# Patient Record
Sex: Male | Born: 1935
Health system: Southern US, Community
[De-identification: ages and names within clinical notes are randomized; demographics above are authoritative.]

## PROBLEM LIST (undated history)

## (undated) DIAGNOSIS — E785 Hyperlipidemia, unspecified: Secondary | ICD-10-CM

## (undated) DIAGNOSIS — K589 Irritable bowel syndrome without diarrhea: Secondary | ICD-10-CM

## (undated) DIAGNOSIS — I10 Essential (primary) hypertension: Secondary | ICD-10-CM

## (undated) DIAGNOSIS — K5790 Diverticulosis of intestine, part unspecified, without perforation or abscess without bleeding: Secondary | ICD-10-CM

## (undated) DIAGNOSIS — F329 Major depressive disorder, single episode, unspecified: Secondary | ICD-10-CM

## (undated) DIAGNOSIS — I2510 Atherosclerotic heart disease of native coronary artery without angina pectoris: Secondary | ICD-10-CM

## (undated) DIAGNOSIS — F419 Anxiety disorder, unspecified: Secondary | ICD-10-CM

## (undated) DIAGNOSIS — C801 Malignant (primary) neoplasm, unspecified: Secondary | ICD-10-CM

## (undated) DIAGNOSIS — M1990 Unspecified osteoarthritis, unspecified site: Secondary | ICD-10-CM

## (undated) DIAGNOSIS — K219 Gastro-esophageal reflux disease without esophagitis: Secondary | ICD-10-CM

## (undated) DIAGNOSIS — N200 Calculus of kidney: Secondary | ICD-10-CM

## (undated) DIAGNOSIS — L570 Actinic keratosis: Secondary | ICD-10-CM

## (undated) DIAGNOSIS — F32A Depression, unspecified: Secondary | ICD-10-CM

## (undated) HISTORY — DX: Actinic keratosis: L57.0

## (undated) HISTORY — PX: APPENDECTOMY: SHX54

---

## 2005-11-11 ENCOUNTER — Ambulatory Visit: Payer: Self-pay | Admitting: Unknown Physician Specialty

## 2006-01-04 ENCOUNTER — Ambulatory Visit: Payer: Self-pay | Admitting: Unknown Physician Specialty

## 2006-12-06 ENCOUNTER — Ambulatory Visit: Payer: Self-pay | Admitting: Unknown Physician Specialty

## 2006-12-13 ENCOUNTER — Ambulatory Visit: Payer: Self-pay | Admitting: Urology

## 2007-12-25 ENCOUNTER — Ambulatory Visit: Payer: Self-pay | Admitting: Urology

## 2008-12-23 ENCOUNTER — Ambulatory Visit: Payer: Self-pay | Admitting: Urology

## 2009-04-23 ENCOUNTER — Ambulatory Visit: Payer: Self-pay | Admitting: Cardiology

## 2009-05-14 ENCOUNTER — Ambulatory Visit: Payer: Self-pay | Admitting: Internal Medicine

## 2009-09-24 ENCOUNTER — Ambulatory Visit: Payer: Self-pay | Admitting: Unknown Physician Specialty

## 2009-12-24 ENCOUNTER — Ambulatory Visit: Payer: Self-pay | Admitting: Urology

## 2010-04-08 ENCOUNTER — Ambulatory Visit: Payer: Self-pay | Admitting: Internal Medicine

## 2010-09-21 ENCOUNTER — Emergency Department: Payer: Self-pay | Admitting: Emergency Medicine

## 2010-10-02 ENCOUNTER — Ambulatory Visit: Payer: Self-pay | Admitting: Internal Medicine

## 2010-12-23 ENCOUNTER — Ambulatory Visit: Payer: Self-pay | Admitting: Urology

## 2011-12-27 ENCOUNTER — Ambulatory Visit: Payer: Self-pay | Admitting: Urology

## 2012-02-06 IMAGING — CT CT HEAD WITHOUT CONTRAST
2 series · 15 of 30 positions shown, 19 images · non-contrast
Comparison: none

REASON FOR EXAM: stroke sx
COMMENTS:   May transport without cardiac monitor

PROCEDURE:     CT  - CT HEAD WITHOUT CONTRAST  - September 21, 2010 [DATE]
RESULT:     Head CT dated 09/21/2010
TECHNIQUE: Helical noncontrasted 5 mm sections were obtained from the skull
base through the vertex.

[Series 2: without · axial · non-contrast · 0.43mm/px · z∈[+920,+1040]mm · 13 of 30 slices shown, 17 images]
[im 3/30  brain]
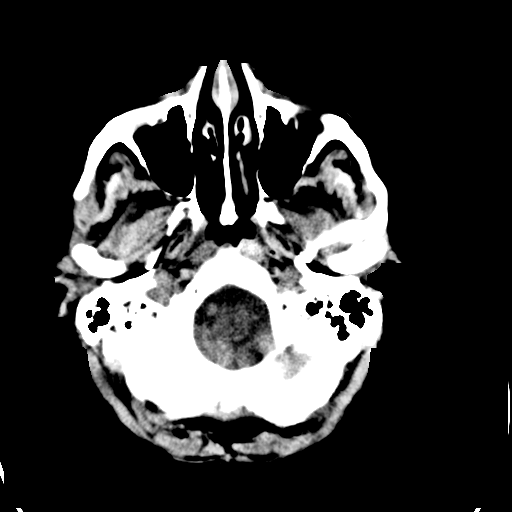
[im 3/30  bone]
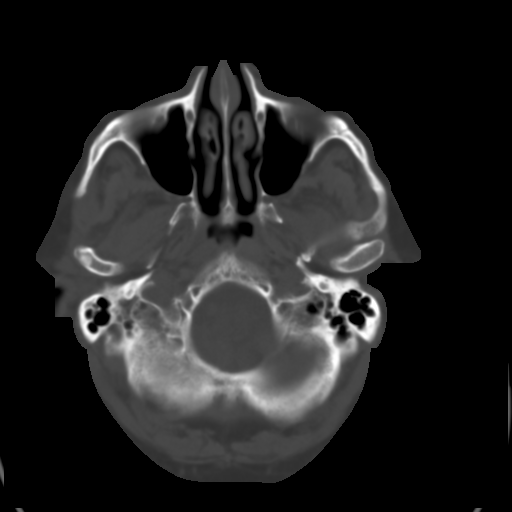
[im 5/30  brain]
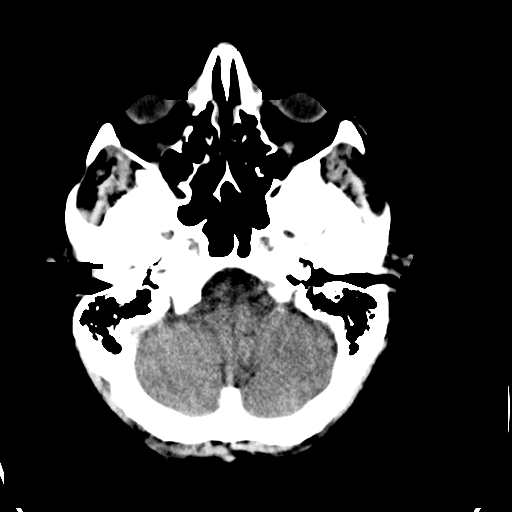
[im 7/30  brain]
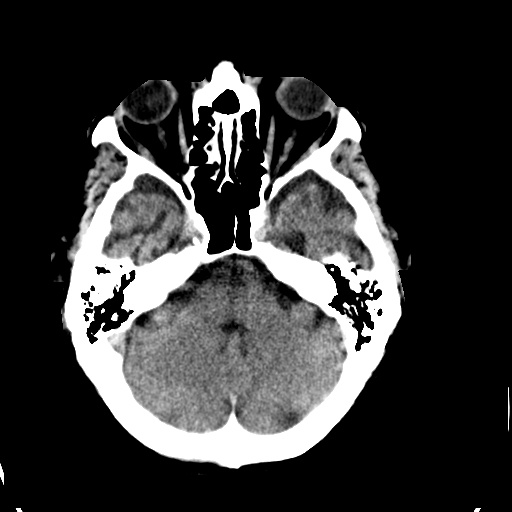
[im 9/30  brain]
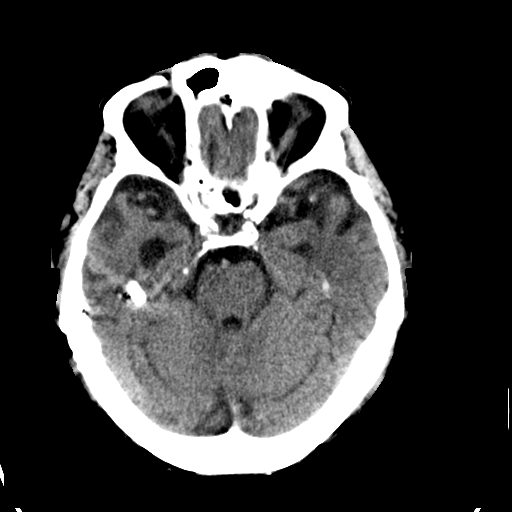
[im 11/30  brain]
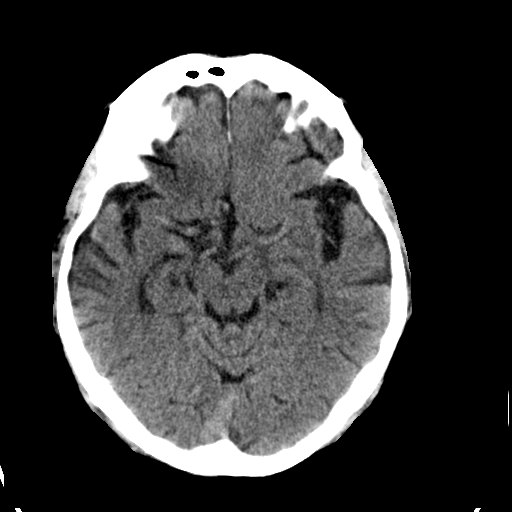
[im 11/30  bone]
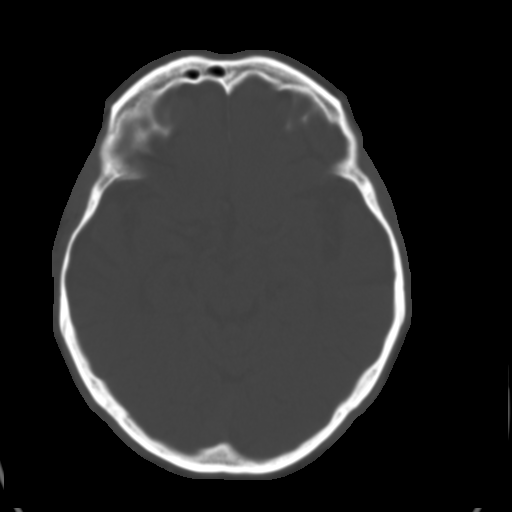
[im 13/30  brain]
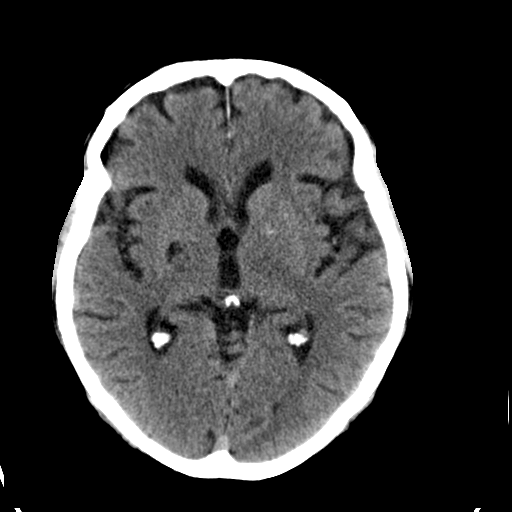
[im 15/30  brain]
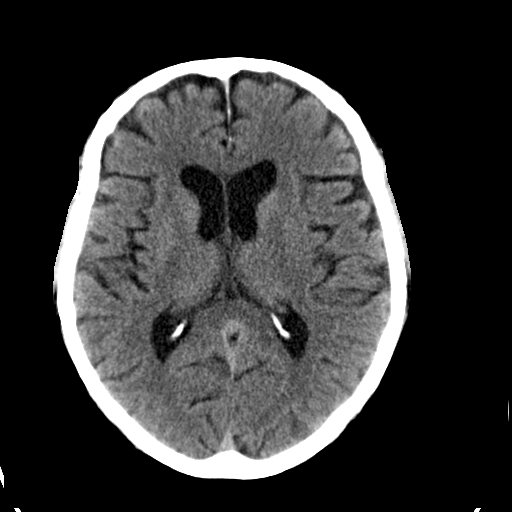
[im 17/30  brain]
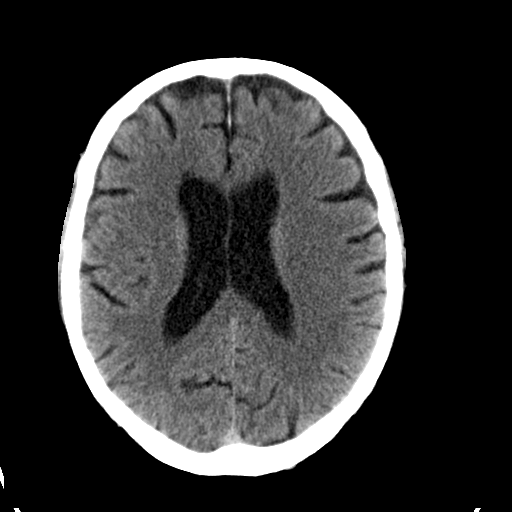
[im 19/30  brain]
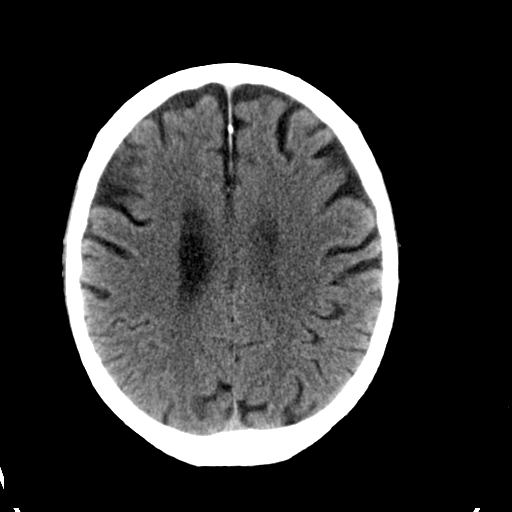
[im 19/30  bone]
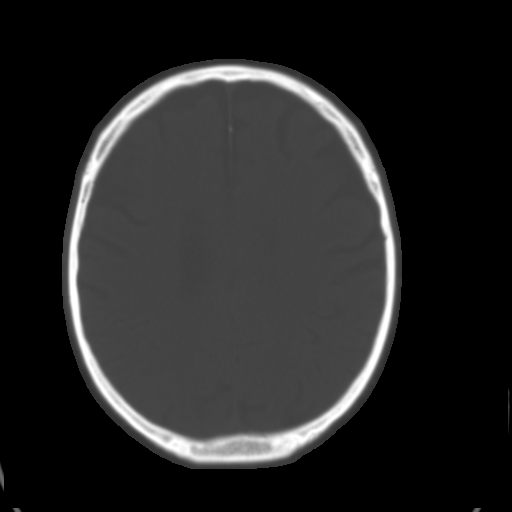
[im 21/30  brain]
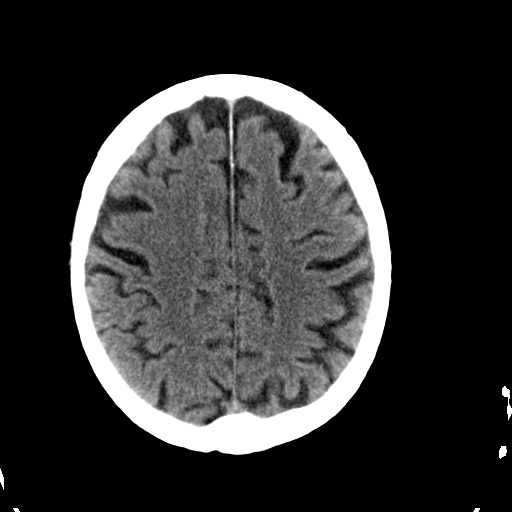
[im 23/30  brain]
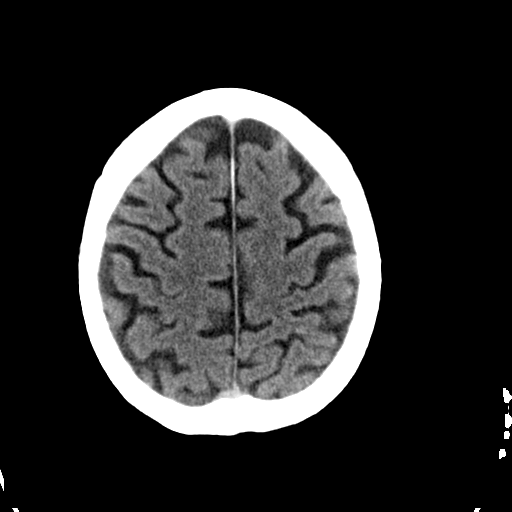
[im 25/30  brain]
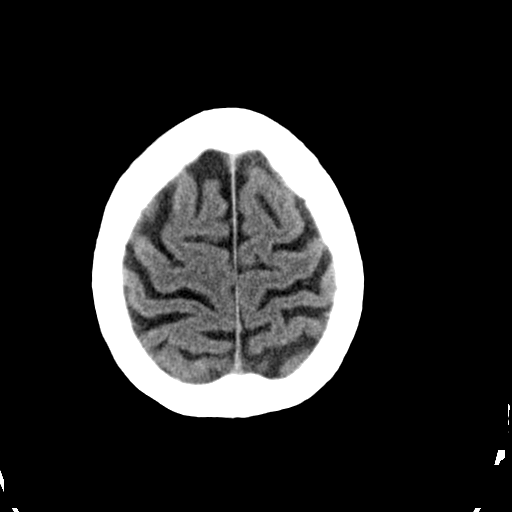
[im 27/30  brain]
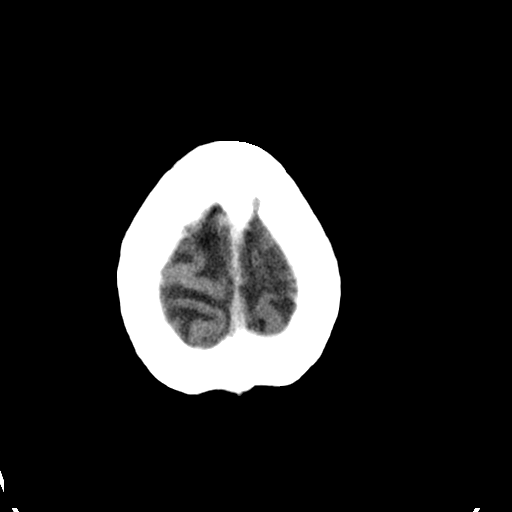
[im 27/30  bone]
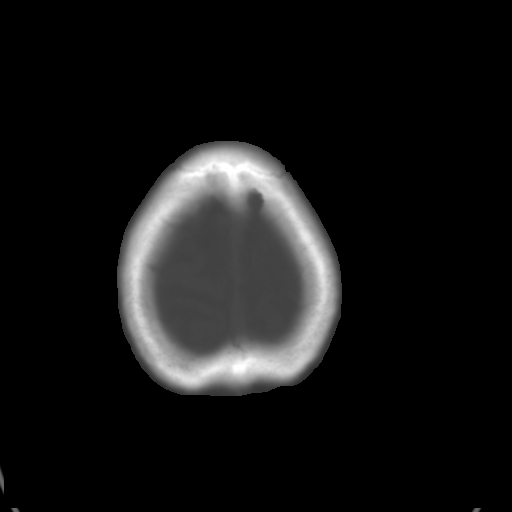

[Series 3: bone · axial · 0.42mm/px · z∈[+920,+940]mm · 2 of 30 slices shown]
[im 3/30  bone]
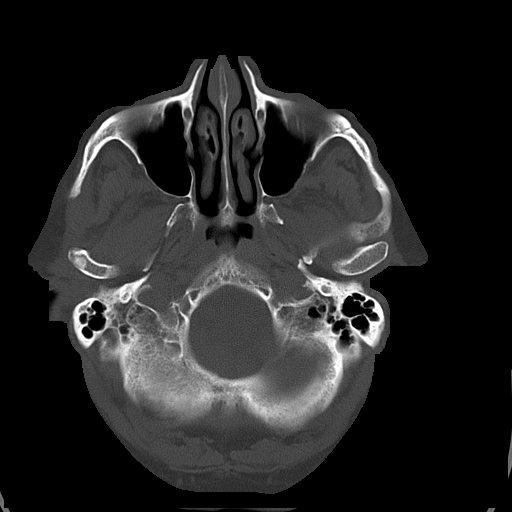
[im 7/30  bone]
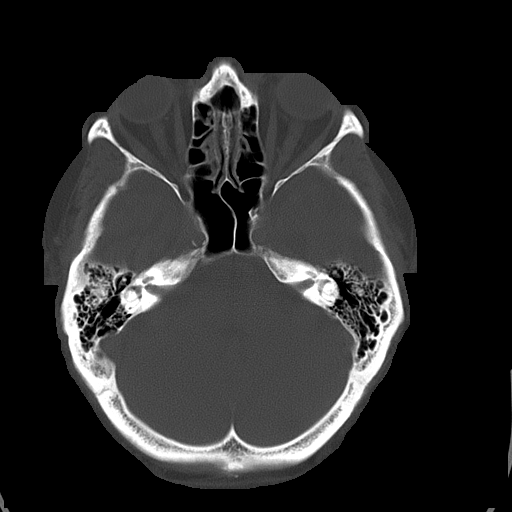

[15 of 30 positions shown; findings below may reference images not displayed]

FINDINGS: There is mild diffuse cortical atrophy. Punctate area of
low-attenuation projects along the posterior aspect of the basal ganglia
centrally border in the posterior limb of internal capsule. This is on the
right. Very mild areas of low attenuation project within the subcortical,
deep, and periventricular white matter regions. The pons and cerebellum are
grossly unremarkable. There is no evidence of a depressed skull fracture.
The ventricles and cisterns are patent. There is no evidence of intra-axial
nor extra-axial fluid collections nor evidence of acute hemorrhage common
nor mass effect. Basal ganglia calcifications are appreciated.
IMPRESSION: Chronic and mild involutional changes without evidence of
acute abnormalities.

## 2012-12-27 ENCOUNTER — Ambulatory Visit: Payer: Self-pay | Admitting: Urology

## 2013-01-15 ENCOUNTER — Ambulatory Visit: Payer: Self-pay | Admitting: Unknown Physician Specialty

## 2013-05-13 IMAGING — US US RENAL KIDNEY
1 series · 17 of 25 positions shown · non-contrast
Comparison: none

REASON FOR EXAM: multiple large cysts
COMMENTS:

[Series 1: us renal kidney · 17 of 40 slices shown]
[im 1/40]
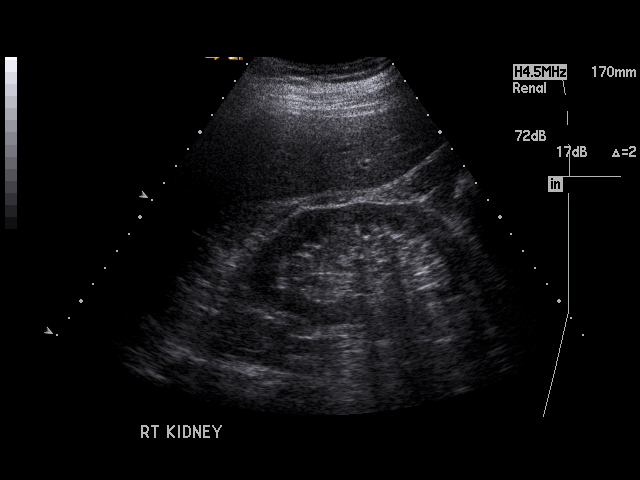
[im 4/40]
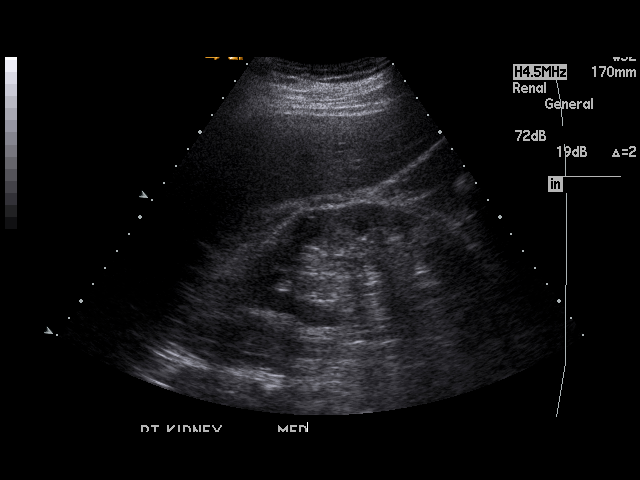
[im 5/40]
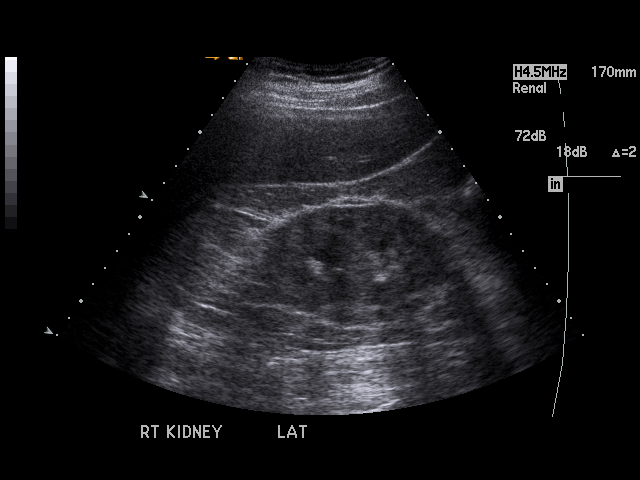
[im 9/40]
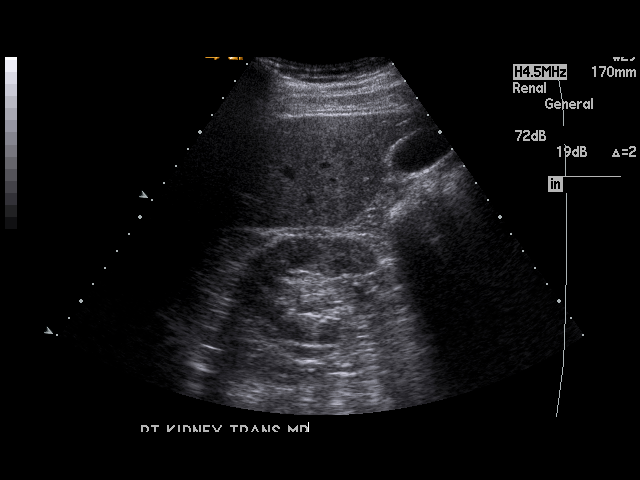
[im 10/40]
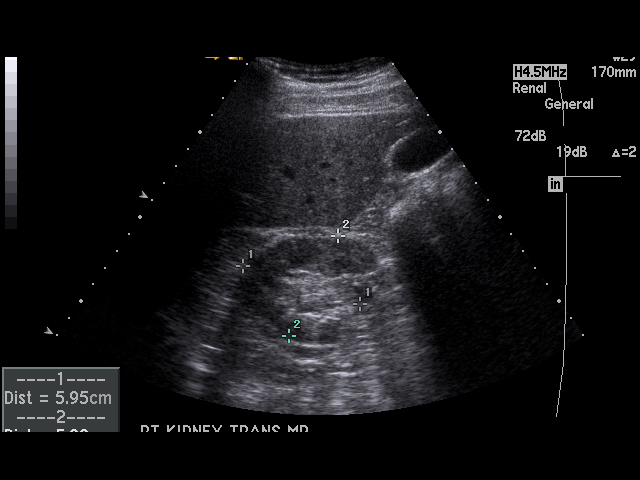
[im 14/40]
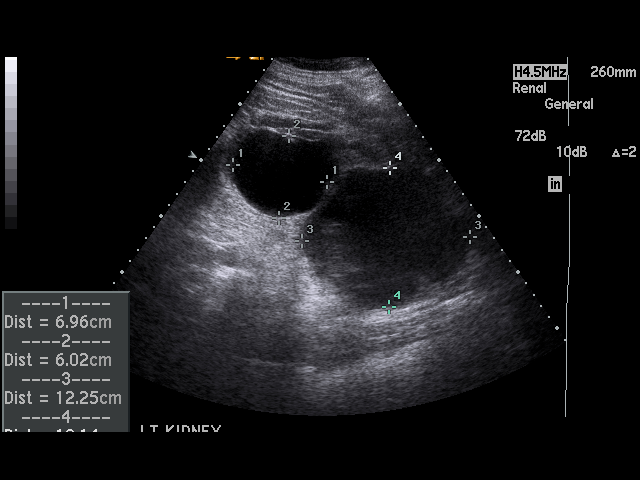
[im 15/40]
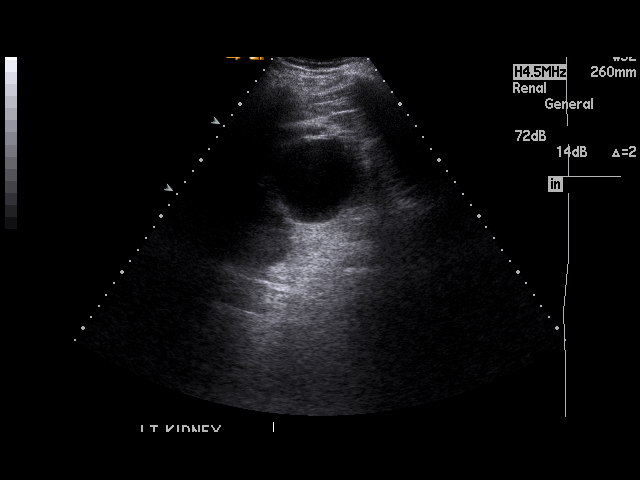
[im 18/40]
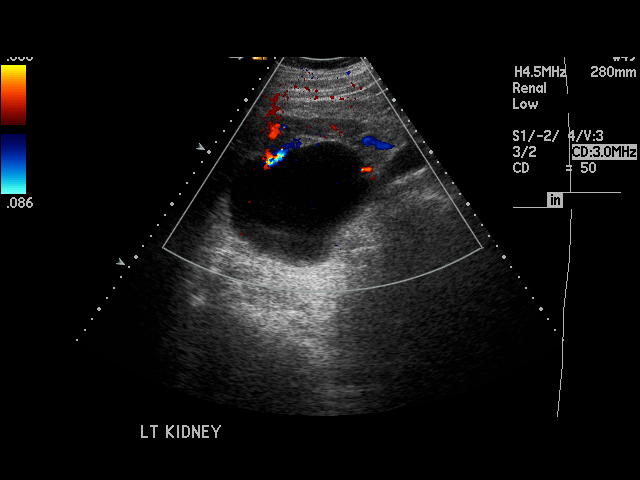
[im 20/40]
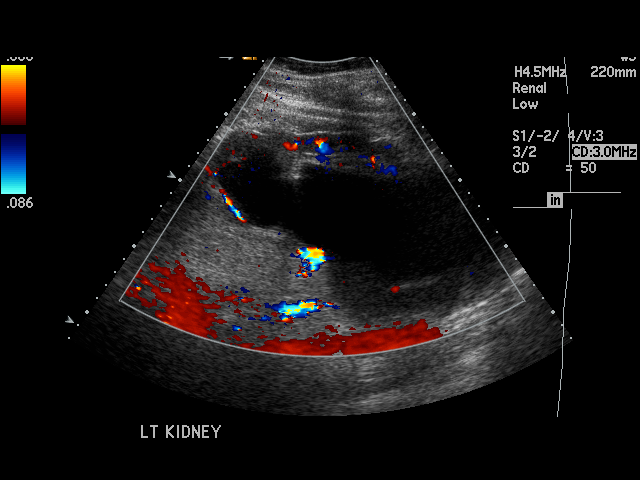
[im 22/40]
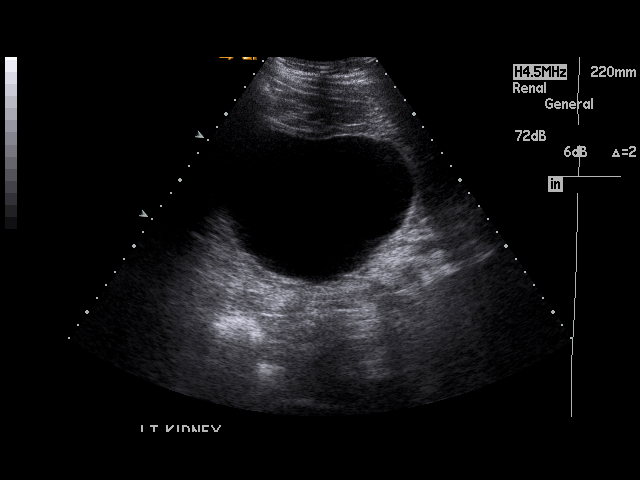
[im 25/40]
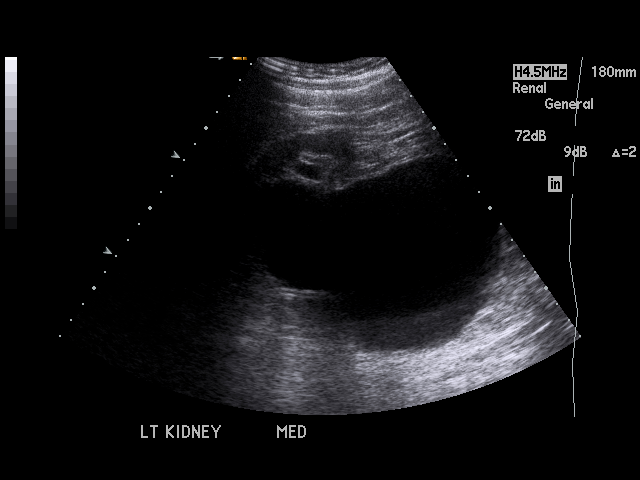
[im 27/40]
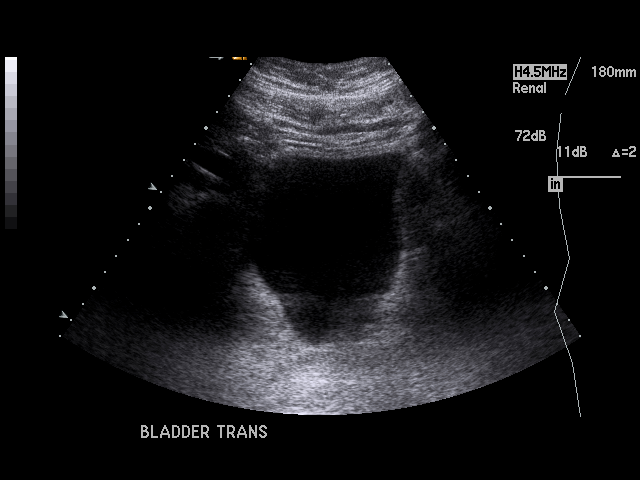
[im 30/40]
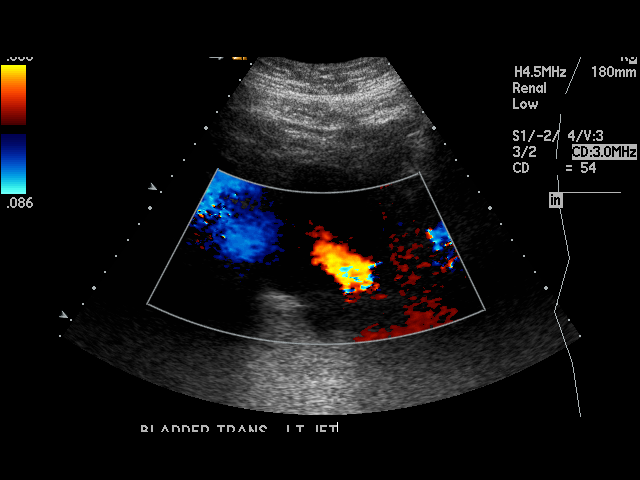
[im 31/40]
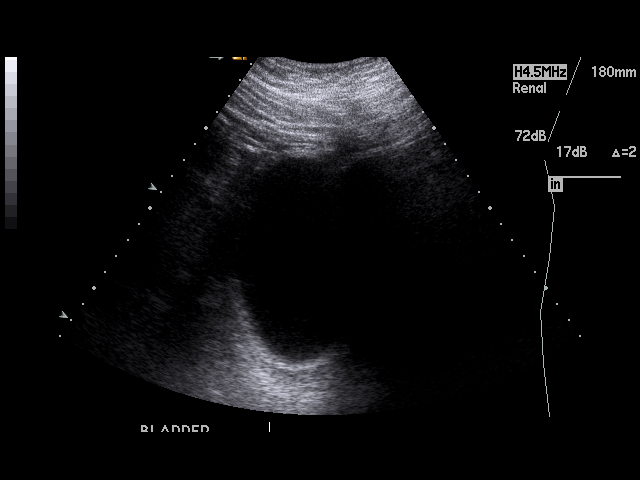
[im 35/40]
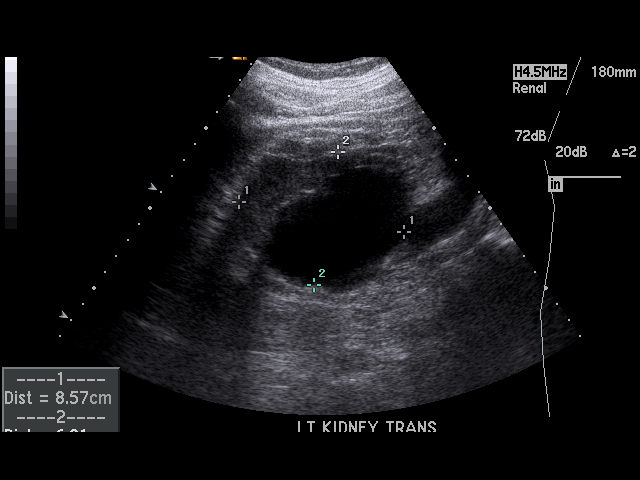
[im 36/40]
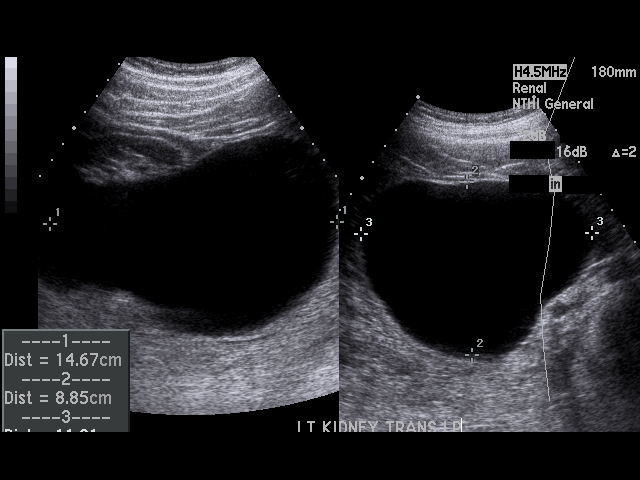
[im 40/40]
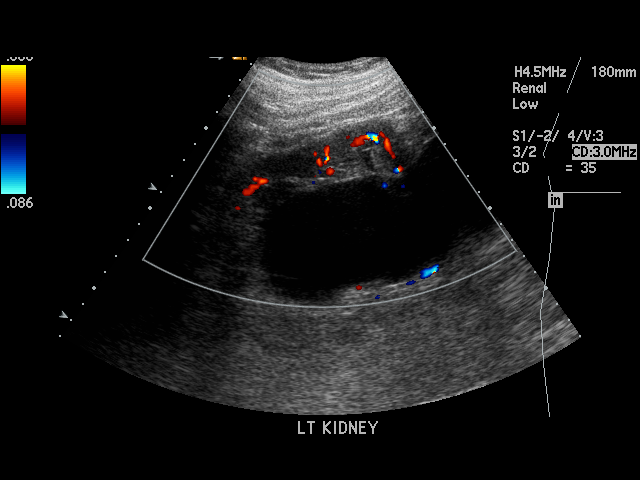

[17 of 25 positions shown; findings below may reference images not displayed]

PROCEDURE:     US  - US KIDNEY  - December 27, 2011  [DATE]

RESULT:     Renal sonogram is compared images from the previous examination
23 December, 2010. Left kidney again demonstrates large cysts in the upper
and lower pole regions with a lower pole cyst measuring up to 14.67 x 8.5 x
11.81 cm in the upper pole cyst measuring 7.57 x 5.58 x 6.75 cm. The right
kidney shows no solid or cystic mass and measures 11.19 x 5.95 x 5.39 cm.
The left kidney a shows a superior inferior dimension of 16.37 cm x 8.57 cm
x 6.91 cm.
IMPRESSION: 1. The large left renal cysts appear to be grossly stable. No definite
obstructive change, stones or solid masses evident.

## 2013-12-26 ENCOUNTER — Ambulatory Visit: Payer: Self-pay | Admitting: Urology

## 2014-01-08 ENCOUNTER — Ambulatory Visit: Payer: Self-pay

## 2014-05-23 DIAGNOSIS — C4491 Basal cell carcinoma of skin, unspecified: Secondary | ICD-10-CM

## 2014-05-23 HISTORY — DX: Basal cell carcinoma of skin, unspecified: C44.91

## 2015-01-31 DIAGNOSIS — N138 Other obstructive and reflux uropathy: Secondary | ICD-10-CM | POA: Diagnosis not present

## 2015-01-31 DIAGNOSIS — E785 Hyperlipidemia, unspecified: Secondary | ICD-10-CM | POA: Diagnosis not present

## 2015-01-31 DIAGNOSIS — N23 Unspecified renal colic: Secondary | ICD-10-CM | POA: Diagnosis not present

## 2015-01-31 DIAGNOSIS — I10 Essential (primary) hypertension: Secondary | ICD-10-CM | POA: Diagnosis not present

## 2015-01-31 DIAGNOSIS — N281 Cyst of kidney, acquired: Secondary | ICD-10-CM | POA: Diagnosis not present

## 2015-01-31 DIAGNOSIS — N401 Enlarged prostate with lower urinary tract symptoms: Secondary | ICD-10-CM | POA: Diagnosis not present

## 2015-05-06 DIAGNOSIS — E780 Pure hypercholesterolemia: Secondary | ICD-10-CM | POA: Diagnosis not present

## 2015-05-06 DIAGNOSIS — Z8673 Personal history of transient ischemic attack (TIA), and cerebral infarction without residual deficits: Secondary | ICD-10-CM | POA: Diagnosis not present

## 2015-05-06 DIAGNOSIS — I10 Essential (primary) hypertension: Secondary | ICD-10-CM | POA: Diagnosis not present

## 2015-05-13 DIAGNOSIS — K219 Gastro-esophageal reflux disease without esophagitis: Secondary | ICD-10-CM | POA: Diagnosis not present

## 2015-05-13 DIAGNOSIS — F329 Major depressive disorder, single episode, unspecified: Secondary | ICD-10-CM | POA: Diagnosis not present

## 2015-05-13 DIAGNOSIS — I10 Essential (primary) hypertension: Secondary | ICD-10-CM | POA: Diagnosis not present

## 2015-05-13 DIAGNOSIS — M1990 Unspecified osteoarthritis, unspecified site: Secondary | ICD-10-CM | POA: Diagnosis not present

## 2015-06-12 DIAGNOSIS — K4090 Unilateral inguinal hernia, without obstruction or gangrene, not specified as recurrent: Secondary | ICD-10-CM | POA: Diagnosis not present

## 2015-06-12 DIAGNOSIS — K648 Other hemorrhoids: Secondary | ICD-10-CM | POA: Diagnosis not present

## 2015-07-02 DIAGNOSIS — K648 Other hemorrhoids: Secondary | ICD-10-CM | POA: Diagnosis not present

## 2015-07-02 DIAGNOSIS — K4020 Bilateral inguinal hernia, without obstruction or gangrene, not specified as recurrent: Secondary | ICD-10-CM | POA: Diagnosis not present

## 2015-07-10 ENCOUNTER — Encounter
Admission: RE | Admit: 2015-07-10 | Discharge: 2015-07-10 | Disposition: A | Discharge status: Home / Self Care | Payer: Commercial Managed Care - HMO | Source: Ambulatory Visit | Attending: Surgery | Admitting: Surgery

## 2015-07-10 DIAGNOSIS — Z01812 Encounter for preprocedural laboratory examination: Secondary | ICD-10-CM | POA: Diagnosis not present

## 2015-07-10 DIAGNOSIS — Z01810 Encounter for preprocedural cardiovascular examination: Secondary | ICD-10-CM | POA: Diagnosis not present

## 2015-07-10 DIAGNOSIS — I10 Essential (primary) hypertension: Secondary | ICD-10-CM | POA: Diagnosis not present

## 2015-07-10 HISTORY — DX: Hyperlipidemia, unspecified: E78.5

## 2015-07-10 HISTORY — DX: Calculus of kidney: N20.0

## 2015-07-10 HISTORY — DX: Unspecified osteoarthritis, unspecified site: M19.90

## 2015-07-10 HISTORY — DX: Essential (primary) hypertension: I10

## 2015-07-10 HISTORY — DX: Irritable bowel syndrome, unspecified: K58.9

## 2015-07-10 HISTORY — DX: Atherosclerotic heart disease of native coronary artery without angina pectoris: I25.10

## 2015-07-10 HISTORY — DX: Anxiety disorder, unspecified: F41.9

## 2015-07-10 HISTORY — DX: Gastro-esophageal reflux disease without esophagitis: K21.9

## 2015-07-10 HISTORY — DX: Malignant (primary) neoplasm, unspecified: C80.1

## 2015-07-10 HISTORY — DX: Diverticulosis of intestine, part unspecified, without perforation or abscess without bleeding: K57.90

## 2015-07-10 HISTORY — DX: Depression, unspecified: F32.A

## 2015-07-10 HISTORY — DX: Major depressive disorder, single episode, unspecified: F32.9

## 2015-07-10 LAB — POTASSIUM: POTASSIUM: 3.2 mmol/L — AB (ref 3.5–5.1)

## 2015-07-10 NOTE — Pre-Procedure Instructions (Signed)
K+ results 3.2 called and faxed to  [facility] (Dr. Rochel Brome office) and instructed that patient need a potassium supplement prior to surgery

## 2015-07-10 NOTE — Patient Instructions (Addendum)
  Your procedure is scheduled on: July 18, 2015 (Friday) Report to Day Surgery. To find out your arrival time please call [phone] between 1PM - 3PM on July 17, 2015 (Thursday).  Remember: Instructions that are not followed completely may result in serious medical risk, up to and including death, or upon the discretion of your surgeon and anesthesiologist your surgery may need to be rescheduled.    __x__ 1. Do not eat food or drink liquids after midnight. No gum chewing or hard candies.     __x__ 2. No Alcohol for 24 hours before or after surgery.   ____ 3. Bring all medications with you on the day of surgery if instructed.    __x__ 4. Notify your doctor if there is any change in your medical condition     (cold, fever, infections).     Do not wear jewelry, make-up, hairpins, clips or nail polish.  Do not wear lotions, powders, or perfumes. You may wear deodorant.  Do not shave 48 hours prior to surgery. Men may shave face and neck.  Do not bring valuables to the hospital.    [facility] is not responsible for any belongings or valuables.               Contacts, dentures or bridgework may not be worn into surgery.  Leave your suitcase in the car. After surgery it may be brought to your room.  For patients admitted to the hospital, discharge time is determined by your                treatment team.   Patients discharged the day of surgery will not be allowed to drive home.   Please read over the following fact sheets that you were given:   Surgical Site Infection Prevention   _x___ Take these medicines the morning of surgery with A SIP OF WATER:    1. Verapamil  2. Prilosec (Prilosec at bedtime on July 17, 2015)  3.   4.  5.  6.  ____ Fleet Enema (as directed)   _x___ Use CHG Soap as directed  ____ Use inhalers on the day of surgery  ____ Stop metformin 2 days prior to surgery    ____ Take 1/2 of usual insulin dose the night before surgery and none on the  morning of surgery.   __x__ Stop Coumadin/Plavix/aspirin on(STOP ASPIRIN NOW--PATIENT STATED HE STOPPED ASPIRIN TODAY) ____ Stop Anti-inflammatories on    ____ Stop supplements until after surgery.    ____ Bring C-Pap to the hospital.

## 2015-07-17 DIAGNOSIS — R899 Unspecified abnormal finding in specimens from other organs, systems and tissues: Secondary | ICD-10-CM | POA: Diagnosis not present

## 2015-07-18 ENCOUNTER — Encounter: Payer: Self-pay | Admitting: *Deleted

## 2015-07-18 ENCOUNTER — Ambulatory Visit: Payer: Commercial Managed Care - HMO | Admitting: *Deleted

## 2015-07-18 ENCOUNTER — Encounter
Admission: RE | Disposition: A | Discharge status: Home / Self Care | Payer: Self-pay | Source: Ambulatory Visit | Attending: Surgery

## 2015-07-18 ENCOUNTER — Ambulatory Visit
Admission: RE | Admit: 2015-07-18 | Discharge: 2015-07-18 | Disposition: A | Discharge status: Home / Self Care | Payer: Commercial Managed Care - HMO | Source: Ambulatory Visit | Attending: Surgery | Admitting: Surgery

## 2015-07-18 DIAGNOSIS — F329 Major depressive disorder, single episode, unspecified: Secondary | ICD-10-CM | POA: Insufficient documentation

## 2015-07-18 DIAGNOSIS — I2510 Atherosclerotic heart disease of native coronary artery without angina pectoris: Secondary | ICD-10-CM | POA: Diagnosis not present

## 2015-07-18 DIAGNOSIS — E669 Obesity, unspecified: Secondary | ICD-10-CM | POA: Diagnosis not present

## 2015-07-18 DIAGNOSIS — E785 Hyperlipidemia, unspecified: Secondary | ICD-10-CM | POA: Diagnosis not present

## 2015-07-18 DIAGNOSIS — I10 Essential (primary) hypertension: Secondary | ICD-10-CM | POA: Insufficient documentation

## 2015-07-18 DIAGNOSIS — Z8673 Personal history of transient ischemic attack (TIA), and cerebral infarction without residual deficits: Secondary | ICD-10-CM | POA: Insufficient documentation

## 2015-07-18 DIAGNOSIS — Z79899 Other long term (current) drug therapy: Secondary | ICD-10-CM | POA: Insufficient documentation

## 2015-07-18 DIAGNOSIS — Z87891 Personal history of nicotine dependence: Secondary | ICD-10-CM | POA: Insufficient documentation

## 2015-07-18 DIAGNOSIS — K219 Gastro-esophageal reflux disease without esophagitis: Secondary | ICD-10-CM | POA: Insufficient documentation

## 2015-07-18 DIAGNOSIS — K4090 Unilateral inguinal hernia, without obstruction or gangrene, not specified as recurrent: Secondary | ICD-10-CM | POA: Diagnosis not present

## 2015-07-18 HISTORY — PX: INGUINAL HERNIA REPAIR: SHX194

## 2015-07-18 LAB — POCT I-STAT 3, (NA,K, HGB,HCT): Potassium, Ser: 3.6

## 2015-07-18 SURGERY — REPAIR, HERNIA, INGUINAL, ADULT
Anesthesia: General | Site: Groin | Laterality: Right | Wound class: Clean

## 2015-07-18 MED ORDER — BUPIVACAINE-EPINEPHRINE (PF) 0.5% -1:200000 IJ SOLN
INTRAMUSCULAR | Status: DC | PRN
  Administered 2015-07-18: 20 mL via PERINEURAL

## 2015-07-18 MED ORDER — EPHEDRINE SULFATE 50 MG/ML IJ SOLN
INTRAMUSCULAR | Status: DC | PRN
  Administered 2015-07-18: 5 mg via INTRAVENOUS

## 2015-07-18 MED ORDER — HYDROCODONE-ACETAMINOPHEN 5-325 MG PO TABS
1.0000 | ORAL_TABLET | ORAL | Status: DC | PRN
  Administered 2015-07-18: 1 via ORAL

## 2015-07-18 MED ORDER — LIDOCAINE HCL (CARDIAC) 20 MG/ML IV SOLN
INTRAVENOUS | Status: DC | PRN
  Administered 2015-07-18: 100 mg via INTRAVENOUS

## 2015-07-18 MED ORDER — BUPIVACAINE-EPINEPHRINE (PF) 0.5% -1:200000 IJ SOLN
INTRAMUSCULAR | Status: AC
  Filled 2015-07-18: qty 30

## 2015-07-18 MED ORDER — GLYCOPYRROLATE 0.2 MG/ML IJ SOLN
INTRAMUSCULAR | Status: DC | PRN
  Administered 2015-07-18: 0.2 mg via INTRAVENOUS

## 2015-07-18 MED ORDER — CEFAZOLIN SODIUM-DEXTROSE 2-3 GM-% IV SOLR
INTRAVENOUS | Status: AC
  Administered 2015-07-18: 2 g via INTRAVENOUS
  Filled 2015-07-18: qty 50

## 2015-07-18 MED ORDER — HYDROMORPHONE HCL 1 MG/ML IJ SOLN
0.2500 mg | INTRAMUSCULAR | Status: DC | PRN
  Administered 2015-07-18 (×2): 0.25 mg via INTRAVENOUS
  Administered 2015-07-18: 0.5 mg via INTRAVENOUS

## 2015-07-18 MED ORDER — PROPOFOL 10 MG/ML IV BOLUS
INTRAVENOUS | Status: DC | PRN
  Administered 2015-07-18: 150 mg via INTRAVENOUS
  Administered 2015-07-18: 50 mg via INTRAVENOUS

## 2015-07-18 MED ORDER — HYDROMORPHONE HCL 1 MG/ML IJ SOLN
INTRAMUSCULAR | Status: AC
  Administered 2015-07-18: 0.25 mg via INTRAVENOUS
  Filled 2015-07-18: qty 1

## 2015-07-18 MED ORDER — HYDROCODONE-ACETAMINOPHEN 5-325 MG PO TABS: 1.0000 | ORAL_TABLET | ORAL | Status: AC | PRN

## 2015-07-18 MED ORDER — ONDANSETRON HCL 4 MG/2ML IJ SOLN: 4.0000 mg | Freq: Once | INTRAMUSCULAR | Status: DC | PRN

## 2015-07-18 MED ORDER — FENTANYL CITRATE (PF) 100 MCG/2ML IJ SOLN
INTRAMUSCULAR | Status: DC | PRN
  Administered 2015-07-18 (×2): 50 ug via INTRAVENOUS

## 2015-07-18 MED ORDER — ONDANSETRON HCL 4 MG/2ML IJ SOLN
INTRAMUSCULAR | Status: DC | PRN
  Administered 2015-07-18: 4 mg via INTRAVENOUS

## 2015-07-18 MED ORDER — DEXAMETHASONE SODIUM PHOSPHATE 4 MG/ML IJ SOLN
INTRAMUSCULAR | Status: DC | PRN
  Administered 2015-07-18: 5 mg via INTRAVENOUS

## 2015-07-18 MED ORDER — CEFAZOLIN SODIUM-DEXTROSE 2-3 GM-% IV SOLR
2.0000 g | Freq: Once | INTRAVENOUS | Status: AC
  Administered 2015-07-18: 2 g via INTRAVENOUS

## 2015-07-18 MED ORDER — MIDAZOLAM HCL 2 MG/2ML IJ SOLN
INTRAMUSCULAR | Status: DC | PRN
  Administered 2015-07-18: 1 mg via INTRAVENOUS

## 2015-07-18 MED ORDER — LACTATED RINGERS IV SOLN
INTRAVENOUS | Status: DC
  Administered 2015-07-18: 13:00:00 via INTRAVENOUS

## 2015-07-18 SURGICAL SUPPLY — 24 items
BLADE SURG 15 STRL LF DISP TIS (BLADE) ×1 IMPLANT
BLADE SURG 15 STRL SS (BLADE) ×2
CANISTER SUCT 1200ML W/VALVE (MISCELLANEOUS) ×3 IMPLANT
CHLORAPREP W/TINT 26ML (MISCELLANEOUS) ×6 IMPLANT
DRAIN PENROSE 5/8X18 LTX STRL (WOUND CARE) ×3 IMPLANT
DRAPE PED LAPAROTOMY (DRAPES) ×3 IMPLANT
GLOVE BIO SURGEON STRL SZ7.5 (GLOVE) ×9 IMPLANT
GOWN STRL REUS W/ TWL LRG LVL3 (GOWN DISPOSABLE) ×2 IMPLANT
GOWN STRL REUS W/TWL LRG LVL3 (GOWN DISPOSABLE) ×4
KIT RM TURNOVER STRD PROC AR (KITS) ×3 IMPLANT
LABEL OR SOLS (LABEL) ×3 IMPLANT
LIQUID BAND (GAUZE/BANDAGES/DRESSINGS) ×3 IMPLANT
MESH SYNTHETIC 4X6 SOFT BARD (Mesh General) ×1 IMPLANT
MESH SYNTHETIC SOFT BARD 4X6 (Mesh General) ×2 IMPLANT
NDL SAFETY 25GX1.5 (NEEDLE) ×3 IMPLANT
NS IRRIG 500ML POUR BTL (IV SOLUTION) ×3 IMPLANT
PACK BASIN MINOR ARMC (MISCELLANEOUS) ×3 IMPLANT
PAD GROUND ADULT SPLIT (MISCELLANEOUS) ×3 IMPLANT
SUT CHROMIC 4 0 RB 1X27 (SUTURE) ×3 IMPLANT
SUT MNCRL AB 4-0 PS2 18 (SUTURE) ×3 IMPLANT
SUT SURGILON 0 30 BLK (SUTURE) ×9 IMPLANT
SUT VIC AB 4-0 SH 27 (SUTURE) ×2
SUT VIC AB 4-0 SH 27XANBCTRL (SUTURE) ×1 IMPLANT
SYRINGE 10CC LL (SYRINGE) ×3 IMPLANT

## 2015-07-18 NOTE — Discharge Instructions (Signed)
Take Tylenol or Norco if needed for pain.  Resume aspirin on Sunday.  Avoid straining and heavy lifting.  May shower.   AMBULATORY SURGERY  DISCHARGE INSTRUCTIONS   1) The drugs that you were given will stay in your system until tomorrow so for the next 24 hours you should not:  A) Drive an automobile B) Make any legal decisions C) Drink any alcoholic beverage   2) You may resume regular meals tomorrow.  Today it is better to start with liquids and gradually work up to solid foods.  You may eat anything you prefer, but it is better to start with liquids, then soup and crackers, and gradually work up to solid foods.   3) Please notify your doctor immediately if you have any unusual bleeding, trouble breathing, redness and pain at the surgery site, drainage, fever, or pain not relieved by medication.    4) Additional Instructions:        Please contact your physician with any problems or Same Day Surgery at [phone], Monday through Friday 6 am to 4 pm, or [place] at [facility] number at [phone].

## 2015-07-18 NOTE — Op Note (Signed)
OPERATIVE REPORT  PREOPERATIVE DIAGNOSIS: right inguinal hernia  POSTOPERATIVE DIAGNOSIS:right  inguinal hernia  PROCEDURE:  right inguinal hernia repair  ANESTHESIA:  General  SURGEON:  Rochel Brome M.D.  INDICATIONS: He reports recent bulging and moderate discomfort in the right groin. A right inguinal hernia was demonstrated on physical exam and repair was recommended for definitive treatment.  With the patient on the operating table in the supine position the right lower quadrant was prepared with clippers and with ChloraPrep and draped in a sterile manner. A transversely oriented suprapubic incision was made and carried down through subcutaneous tissues. Electrocautery was used for hemostasis. Two traversing veins were divided between 4-0 chromic suture ligatures. 2 The Scarpa's fascia was incised. The external oblique aponeurosis was incised along the course of its fibers to open the external ring and expose the inguinal cord structures. The cord structures were mobilized. A Penrose drain was passed around the cord structures for traction. There was a large direct inguinal hernia identified. Cremaster fibers were spread and demonstrated no indirect hernia. The direct hernia sac was dissected free from surrounding structures and was approximately 8 cm in length the attenuated transversalis fascia was incised circumferentially and removed. The sac was inverted. The floor of the inguinal canal was repaired with a row of 0 Surgilon sutures suturing the conjoined tendon to the shelving edge of the inguinal ligament incorporating transversalis fascia into the repair. The last stitch led to satisfactory narrowing of the internal ring. Bard soft mesh was cut to create an oval shape and was placed over the repair. This was sutured to the repair with interrupted 0 Surgilon sutures and also sutured medially to the deep fascia and on both sides of the internal ring. Next after seeing hemostasis was intact  the cord structures were replaced along the floor of the inguinal canal. The cut edges of the external oblique aponeurosis were closed with a running 4-0 Vicryl suture to re-create the external ring. The deep fascia superior and lateral to the repair site was infiltrated with half percent Sensorcaine with epinephrine. Subcutaneous tissues were also infiltrated. The Scarpa's fascia was closed with interrupted 4-0 Vicryl sutures. The skin was closed with running 4-0 Monocryl subcuticular suture and LiquiBand. The testicle remained in the scrotum  The patient appeared to be in satisfactory condition and was prepared for transfer to the recovery room.  Rochel Brome M.D.

## 2015-07-18 NOTE — Transfer of Care (Signed)
Immediate Anesthesia Transfer of Care Note  Patient: Edward Winters  Procedure(s) Performed: Procedure(s): HERNIA REPAIR INGUINAL ADULT (Right)  Patient Location: PACU  Anesthesia Type:General  Level of Consciousness: awake, alert , oriented and patient cooperative  Airway & Oxygen Therapy: Patient Spontanous Breathing and Patient connected to face mask oxygen  Post-op Assessment: Report given to RN, Post -op Vital signs reviewed and stable and Patient moving all extremities X 4  Post vital signs: Reviewed and stable  Last Vitals:  Filed Vitals:   07/18/15 1528  BP: 153/79  Pulse: 71  Temp: 36.3 C  Resp: 14    Complications: No apparent anesthesia complications

## 2015-07-18 NOTE — H&P (Signed)
  No change in condition since office visit.  Supplementing k  Marked right side, discussed plan for surgery.

## 2015-07-18 NOTE — Anesthesia Preprocedure Evaluation (Addendum)
Anesthesia Evaluation  Patient identified by MRN, date of birth, ID band Patient confused    Reviewed: Allergy & Precautions, NPO status , Patient's Chart, lab work & pertinent test results  Airway Mallampati: III  TM Distance: >3 FB Neck ROM: Limited    Dental  (+) Caps, Dental Advisory Given   Pulmonary former smoker,    Pulmonary exam normal       Cardiovascular Exercise Tolerance: Poor hypertension, Pt. on medications + CAD Rhythm:Regular Rate:Normal     Neuro/Psych Depression Left leg weakness. CVA, Residual Symptoms    GI/Hepatic GERD-  Medicated and Controlled,  Endo/Other    Renal/GU      Musculoskeletal   Abdominal (+) + obese,   Peds  Hematology   Anesthesia Other Findings   Reproductive/Obstetrics                            Anesthesia Physical Anesthesia Plan  ASA: III  Anesthesia Plan: General   Post-op Pain Management:    Induction: Intravenous  Airway Management Planned: LMA  Additional Equipment:   Intra-op Plan:   Post-operative Plan: Extubation in OR  Informed Consent: I have reviewed the patients History and Physical, chart, labs and discussed the procedure including the risks, benefits and alternatives for the proposed anesthesia with the patient or authorized representative who has indicated his/her understanding and acceptance.     Plan Discussed with: CRNA  Anesthesia Plan Comments:         Anesthesia Quick Evaluation

## 2015-07-18 NOTE — Anesthesia Postprocedure Evaluation (Signed)
  Anesthesia Post-op Note  Patient: Edward Winters  Procedure(s) Performed: Procedure(s): HERNIA REPAIR INGUINAL ADULT (Right)  Anesthesia type:General  Patient location: PACU  Post pain: Pain level controlled  Post assessment: Post-op Vital signs reviewed, Patient's Cardiovascular Status Stable, Respiratory Function Stable, Patent Airway and No signs of Nausea or vomiting  Post vital signs: Reviewed and stable  Last Vitals:  Filed Vitals:   07/18/15 1302  BP: 143/69  Pulse: 64  Temp: 36.7 C  Resp: 16    Level of consciousness: awake, alert  and patient cooperative  Complications: No apparent anesthesia complications

## 2015-07-18 NOTE — Anesthesia Procedure Notes (Signed)
Procedure Name: LMA Insertion Date/Time: 07/18/2015 1:55 PM Performed by: Silvana Newness Pre-anesthesia Checklist: Patient identified, Emergency Drugs available, Suction available, Patient being monitored and Timeout performed Patient Re-evaluated:Patient Re-evaluated prior to inductionOxygen Delivery Method: Circle system utilized Preoxygenation: Pre-oxygenation with 100% oxygen Intubation Type: IV induction Ventilation: Mask ventilation without difficulty and Oral airway inserted - appropriate to patient size LMA: LMA inserted LMA Size: 4.5 Number of attempts: 1 Placement Confirmation: positive ETCO2 and breath sounds checked- equal and bilateral Tube secured with: Tape Dental Injury: Teeth and Oropharynx as per pre-operative assessment

## 2015-07-21 ENCOUNTER — Encounter: Payer: Self-pay | Admitting: Surgery

## 2015-09-08 DIAGNOSIS — L570 Actinic keratosis: Secondary | ICD-10-CM | POA: Diagnosis not present

## 2015-09-08 DIAGNOSIS — L720 Epidermal cyst: Secondary | ICD-10-CM | POA: Diagnosis not present

## 2015-09-08 DIAGNOSIS — Z1283 Encounter for screening for malignant neoplasm of skin: Secondary | ICD-10-CM | POA: Diagnosis not present

## 2015-09-08 DIAGNOSIS — D229 Melanocytic nevi, unspecified: Secondary | ICD-10-CM | POA: Diagnosis not present

## 2015-09-08 DIAGNOSIS — L578 Other skin changes due to chronic exposure to nonionizing radiation: Secondary | ICD-10-CM | POA: Diagnosis not present

## 2015-09-08 DIAGNOSIS — Z85828 Personal history of other malignant neoplasm of skin: Secondary | ICD-10-CM | POA: Diagnosis not present

## 2015-09-08 DIAGNOSIS — D1800 Hemangioma unspecified site: Secondary | ICD-10-CM | POA: Diagnosis not present

## 2015-09-08 DIAGNOSIS — L821 Other seborrheic keratosis: Secondary | ICD-10-CM | POA: Diagnosis not present

## 2015-12-02 DIAGNOSIS — I10 Essential (primary) hypertension: Secondary | ICD-10-CM | POA: Diagnosis not present

## 2015-12-02 DIAGNOSIS — K219 Gastro-esophageal reflux disease without esophagitis: Secondary | ICD-10-CM | POA: Diagnosis not present

## 2015-12-02 DIAGNOSIS — M1990 Unspecified osteoarthritis, unspecified site: Secondary | ICD-10-CM | POA: Diagnosis not present

## 2015-12-10 DIAGNOSIS — N183 Chronic kidney disease, stage 3 (moderate): Secondary | ICD-10-CM | POA: Diagnosis not present

## 2015-12-10 DIAGNOSIS — Z8673 Personal history of transient ischemic attack (TIA), and cerebral infarction without residual deficits: Secondary | ICD-10-CM | POA: Diagnosis not present

## 2015-12-10 DIAGNOSIS — M1990 Unspecified osteoarthritis, unspecified site: Secondary | ICD-10-CM | POA: Diagnosis not present

## 2015-12-10 DIAGNOSIS — K219 Gastro-esophageal reflux disease without esophagitis: Secondary | ICD-10-CM | POA: Diagnosis not present

## 2015-12-10 DIAGNOSIS — N401 Enlarged prostate with lower urinary tract symptoms: Secondary | ICD-10-CM | POA: Diagnosis not present

## 2015-12-10 DIAGNOSIS — I10 Essential (primary) hypertension: Secondary | ICD-10-CM | POA: Diagnosis not present

## 2015-12-10 DIAGNOSIS — R229 Localized swelling, mass and lump, unspecified: Secondary | ICD-10-CM | POA: Diagnosis not present

## 2015-12-10 DIAGNOSIS — F325 Major depressive disorder, single episode, in full remission: Secondary | ICD-10-CM | POA: Diagnosis not present

## 2015-12-10 DIAGNOSIS — E7800 Pure hypercholesterolemia, unspecified: Secondary | ICD-10-CM | POA: Diagnosis not present

## 2016-01-05 DIAGNOSIS — L821 Other seborrheic keratosis: Secondary | ICD-10-CM | POA: Diagnosis not present

## 2016-01-05 DIAGNOSIS — L820 Inflamed seborrheic keratosis: Secondary | ICD-10-CM | POA: Diagnosis not present

## 2016-01-05 DIAGNOSIS — Z85828 Personal history of other malignant neoplasm of skin: Secondary | ICD-10-CM | POA: Diagnosis not present

## 2016-01-05 DIAGNOSIS — L720 Epidermal cyst: Secondary | ICD-10-CM | POA: Diagnosis not present

## 2016-01-05 DIAGNOSIS — D1800 Hemangioma unspecified site: Secondary | ICD-10-CM | POA: Diagnosis not present

## 2016-02-03 DIAGNOSIS — L720 Epidermal cyst: Secondary | ICD-10-CM | POA: Diagnosis not present

## 2016-02-03 DIAGNOSIS — D229 Melanocytic nevi, unspecified: Secondary | ICD-10-CM | POA: Diagnosis not present

## 2016-02-03 DIAGNOSIS — D485 Neoplasm of uncertain behavior of skin: Secondary | ICD-10-CM | POA: Diagnosis not present

## 2016-03-03 DIAGNOSIS — N401 Enlarged prostate with lower urinary tract symptoms: Secondary | ICD-10-CM | POA: Diagnosis not present

## 2016-03-03 DIAGNOSIS — Z7982 Long term (current) use of aspirin: Secondary | ICD-10-CM | POA: Diagnosis not present

## 2016-03-03 DIAGNOSIS — I679 Cerebrovascular disease, unspecified: Secondary | ICD-10-CM | POA: Diagnosis not present

## 2016-03-03 DIAGNOSIS — N281 Cyst of kidney, acquired: Secondary | ICD-10-CM | POA: Diagnosis not present

## 2016-03-03 DIAGNOSIS — E785 Hyperlipidemia, unspecified: Secondary | ICD-10-CM | POA: Diagnosis not present

## 2016-03-03 DIAGNOSIS — I10 Essential (primary) hypertension: Secondary | ICD-10-CM | POA: Diagnosis not present

## 2016-03-03 DIAGNOSIS — N138 Other obstructive and reflux uropathy: Secondary | ICD-10-CM | POA: Diagnosis not present

## 2016-03-03 DIAGNOSIS — M21372 Foot drop, left foot: Secondary | ICD-10-CM | POA: Diagnosis not present

## 2016-03-03 DIAGNOSIS — Z6832 Body mass index (BMI) 32.0-32.9, adult: Secondary | ICD-10-CM | POA: Diagnosis not present

## 2016-03-03 DIAGNOSIS — Z79899 Other long term (current) drug therapy: Secondary | ICD-10-CM | POA: Diagnosis not present

## 2016-03-03 DIAGNOSIS — N23 Unspecified renal colic: Secondary | ICD-10-CM | POA: Diagnosis not present

## 2016-03-03 DIAGNOSIS — Z87891 Personal history of nicotine dependence: Secondary | ICD-10-CM | POA: Diagnosis not present

## 2016-06-02 DIAGNOSIS — N183 Chronic kidney disease, stage 3 (moderate): Secondary | ICD-10-CM | POA: Diagnosis not present

## 2016-06-02 DIAGNOSIS — I10 Essential (primary) hypertension: Secondary | ICD-10-CM | POA: Diagnosis not present

## 2016-06-02 DIAGNOSIS — Z8673 Personal history of transient ischemic attack (TIA), and cerebral infarction without residual deficits: Secondary | ICD-10-CM | POA: Diagnosis not present

## 2016-06-02 DIAGNOSIS — M1990 Unspecified osteoarthritis, unspecified site: Secondary | ICD-10-CM | POA: Diagnosis not present

## 2016-06-10 DIAGNOSIS — N183 Chronic kidney disease, stage 3 (moderate): Secondary | ICD-10-CM | POA: Diagnosis not present

## 2016-06-10 DIAGNOSIS — I10 Essential (primary) hypertension: Secondary | ICD-10-CM | POA: Diagnosis not present

## 2016-06-10 DIAGNOSIS — F325 Major depressive disorder, single episode, in full remission: Secondary | ICD-10-CM | POA: Diagnosis not present

## 2016-06-10 DIAGNOSIS — E7800 Pure hypercholesterolemia, unspecified: Secondary | ICD-10-CM | POA: Diagnosis not present

## 2016-06-10 DIAGNOSIS — K219 Gastro-esophageal reflux disease without esophagitis: Secondary | ICD-10-CM | POA: Diagnosis not present

## 2016-06-10 DIAGNOSIS — M1990 Unspecified osteoarthritis, unspecified site: Secondary | ICD-10-CM | POA: Diagnosis not present

## 2016-06-10 DIAGNOSIS — Z8673 Personal history of transient ischemic attack (TIA), and cerebral infarction without residual deficits: Secondary | ICD-10-CM | POA: Diagnosis not present

## 2016-09-08 DIAGNOSIS — L570 Actinic keratosis: Secondary | ICD-10-CM | POA: Diagnosis not present

## 2016-09-08 DIAGNOSIS — L578 Other skin changes due to chronic exposure to nonionizing radiation: Secondary | ICD-10-CM | POA: Diagnosis not present

## 2016-09-08 DIAGNOSIS — Z85828 Personal history of other malignant neoplasm of skin: Secondary | ICD-10-CM | POA: Diagnosis not present

## 2016-09-08 DIAGNOSIS — D229 Melanocytic nevi, unspecified: Secondary | ICD-10-CM | POA: Diagnosis not present

## 2016-09-08 DIAGNOSIS — L718 Other rosacea: Secondary | ICD-10-CM | POA: Diagnosis not present

## 2016-09-08 DIAGNOSIS — L812 Freckles: Secondary | ICD-10-CM | POA: Diagnosis not present

## 2016-09-08 DIAGNOSIS — Z1283 Encounter for screening for malignant neoplasm of skin: Secondary | ICD-10-CM | POA: Diagnosis not present

## 2016-09-08 DIAGNOSIS — D1800 Hemangioma unspecified site: Secondary | ICD-10-CM | POA: Diagnosis not present

## 2016-09-08 DIAGNOSIS — L821 Other seborrheic keratosis: Secondary | ICD-10-CM | POA: Diagnosis not present

## 2016-12-03 DIAGNOSIS — Z8673 Personal history of transient ischemic attack (TIA), and cerebral infarction without residual deficits: Secondary | ICD-10-CM | POA: Diagnosis not present

## 2016-12-03 DIAGNOSIS — N183 Chronic kidney disease, stage 3 (moderate): Secondary | ICD-10-CM | POA: Diagnosis not present

## 2016-12-03 DIAGNOSIS — I10 Essential (primary) hypertension: Secondary | ICD-10-CM | POA: Diagnosis not present

## 2016-12-03 DIAGNOSIS — M1990 Unspecified osteoarthritis, unspecified site: Secondary | ICD-10-CM | POA: Diagnosis not present

## 2016-12-10 DIAGNOSIS — Z0000 Encounter for general adult medical examination without abnormal findings: Secondary | ICD-10-CM | POA: Diagnosis not present

## 2016-12-10 DIAGNOSIS — N183 Chronic kidney disease, stage 3 (moderate): Secondary | ICD-10-CM | POA: Diagnosis not present

## 2016-12-10 DIAGNOSIS — N401 Enlarged prostate with lower urinary tract symptoms: Secondary | ICD-10-CM | POA: Diagnosis not present

## 2016-12-10 DIAGNOSIS — I10 Essential (primary) hypertension: Secondary | ICD-10-CM | POA: Diagnosis not present

## 2016-12-10 DIAGNOSIS — K219 Gastro-esophageal reflux disease without esophagitis: Secondary | ICD-10-CM | POA: Diagnosis not present

## 2016-12-10 DIAGNOSIS — M1990 Unspecified osteoarthritis, unspecified site: Secondary | ICD-10-CM | POA: Diagnosis not present

## 2016-12-10 DIAGNOSIS — F325 Major depressive disorder, single episode, in full remission: Secondary | ICD-10-CM | POA: Diagnosis not present

## 2016-12-10 DIAGNOSIS — R3911 Hesitancy of micturition: Secondary | ICD-10-CM | POA: Diagnosis not present

## 2016-12-10 DIAGNOSIS — E7800 Pure hypercholesterolemia, unspecified: Secondary | ICD-10-CM | POA: Diagnosis not present

## 2017-04-07 DIAGNOSIS — N2889 Other specified disorders of kidney and ureter: Secondary | ICD-10-CM | POA: Diagnosis not present

## 2017-04-07 DIAGNOSIS — Q6102 Congenital multiple renal cysts: Secondary | ICD-10-CM | POA: Diagnosis not present

## 2017-04-07 DIAGNOSIS — N281 Cyst of kidney, acquired: Secondary | ICD-10-CM | POA: Diagnosis not present

## 2017-04-07 DIAGNOSIS — N23 Unspecified renal colic: Secondary | ICD-10-CM | POA: Diagnosis not present

## 2017-05-04 DIAGNOSIS — N23 Unspecified renal colic: Secondary | ICD-10-CM | POA: Diagnosis not present

## 2017-05-04 DIAGNOSIS — N281 Cyst of kidney, acquired: Secondary | ICD-10-CM | POA: Diagnosis not present

## 2017-05-04 DIAGNOSIS — Z6832 Body mass index (BMI) 32.0-32.9, adult: Secondary | ICD-10-CM | POA: Diagnosis not present

## 2017-05-04 DIAGNOSIS — N138 Other obstructive and reflux uropathy: Secondary | ICD-10-CM | POA: Diagnosis not present

## 2017-05-04 DIAGNOSIS — N401 Enlarged prostate with lower urinary tract symptoms: Secondary | ICD-10-CM | POA: Diagnosis not present

## 2017-06-06 DIAGNOSIS — N183 Chronic kidney disease, stage 3 (moderate): Secondary | ICD-10-CM | POA: Diagnosis not present

## 2017-06-06 DIAGNOSIS — I10 Essential (primary) hypertension: Secondary | ICD-10-CM | POA: Diagnosis not present

## 2017-06-06 DIAGNOSIS — M1990 Unspecified osteoarthritis, unspecified site: Secondary | ICD-10-CM | POA: Diagnosis not present

## 2017-06-13 DIAGNOSIS — N401 Enlarged prostate with lower urinary tract symptoms: Secondary | ICD-10-CM | POA: Diagnosis not present

## 2017-06-13 DIAGNOSIS — R3911 Hesitancy of micturition: Secondary | ICD-10-CM | POA: Diagnosis not present

## 2017-06-13 DIAGNOSIS — N183 Chronic kidney disease, stage 3 (moderate): Secondary | ICD-10-CM | POA: Diagnosis not present

## 2017-06-13 DIAGNOSIS — I10 Essential (primary) hypertension: Secondary | ICD-10-CM | POA: Diagnosis not present

## 2017-06-13 DIAGNOSIS — E7800 Pure hypercholesterolemia, unspecified: Secondary | ICD-10-CM | POA: Diagnosis not present

## 2017-06-13 DIAGNOSIS — Z8673 Personal history of transient ischemic attack (TIA), and cerebral infarction without residual deficits: Secondary | ICD-10-CM | POA: Diagnosis not present

## 2017-06-13 DIAGNOSIS — K219 Gastro-esophageal reflux disease without esophagitis: Secondary | ICD-10-CM | POA: Diagnosis not present

## 2017-06-13 DIAGNOSIS — F325 Major depressive disorder, single episode, in full remission: Secondary | ICD-10-CM | POA: Diagnosis not present

## 2017-09-15 DIAGNOSIS — D1801 Hemangioma of skin and subcutaneous tissue: Secondary | ICD-10-CM | POA: Diagnosis not present

## 2017-09-15 DIAGNOSIS — L578 Other skin changes due to chronic exposure to nonionizing radiation: Secondary | ICD-10-CM | POA: Diagnosis not present

## 2017-09-15 DIAGNOSIS — L570 Actinic keratosis: Secondary | ICD-10-CM | POA: Diagnosis not present

## 2017-09-15 DIAGNOSIS — L814 Other melanin hyperpigmentation: Secondary | ICD-10-CM | POA: Diagnosis not present

## 2017-09-15 DIAGNOSIS — L820 Inflamed seborrheic keratosis: Secondary | ICD-10-CM | POA: Diagnosis not present

## 2017-09-15 DIAGNOSIS — L821 Other seborrheic keratosis: Secondary | ICD-10-CM | POA: Diagnosis not present

## 2017-09-15 DIAGNOSIS — Z85828 Personal history of other malignant neoplasm of skin: Secondary | ICD-10-CM | POA: Diagnosis not present

## 2017-09-15 DIAGNOSIS — Z1283 Encounter for screening for malignant neoplasm of skin: Secondary | ICD-10-CM | POA: Diagnosis not present

## 2017-09-15 DIAGNOSIS — D229 Melanocytic nevi, unspecified: Secondary | ICD-10-CM | POA: Diagnosis not present

## 2017-12-05 DIAGNOSIS — N183 Chronic kidney disease, stage 3 (moderate): Secondary | ICD-10-CM | POA: Diagnosis not present

## 2017-12-05 DIAGNOSIS — Z8673 Personal history of transient ischemic attack (TIA), and cerebral infarction without residual deficits: Secondary | ICD-10-CM | POA: Diagnosis not present

## 2017-12-05 DIAGNOSIS — K219 Gastro-esophageal reflux disease without esophagitis: Secondary | ICD-10-CM | POA: Diagnosis not present

## 2017-12-05 DIAGNOSIS — E7800 Pure hypercholesterolemia, unspecified: Secondary | ICD-10-CM | POA: Diagnosis not present

## 2017-12-05 DIAGNOSIS — I10 Essential (primary) hypertension: Secondary | ICD-10-CM | POA: Diagnosis not present

## 2017-12-12 DIAGNOSIS — Z23 Encounter for immunization: Secondary | ICD-10-CM | POA: Diagnosis not present

## 2017-12-12 DIAGNOSIS — E7800 Pure hypercholesterolemia, unspecified: Secondary | ICD-10-CM | POA: Diagnosis not present

## 2017-12-12 DIAGNOSIS — M1990 Unspecified osteoarthritis, unspecified site: Secondary | ICD-10-CM | POA: Diagnosis not present

## 2017-12-12 DIAGNOSIS — Z8673 Personal history of transient ischemic attack (TIA), and cerebral infarction without residual deficits: Secondary | ICD-10-CM | POA: Diagnosis not present

## 2017-12-12 DIAGNOSIS — K219 Gastro-esophageal reflux disease without esophagitis: Secondary | ICD-10-CM | POA: Diagnosis not present

## 2017-12-12 DIAGNOSIS — F325 Major depressive disorder, single episode, in full remission: Secondary | ICD-10-CM | POA: Diagnosis not present

## 2017-12-12 DIAGNOSIS — N183 Chronic kidney disease, stage 3 (moderate): Secondary | ICD-10-CM | POA: Diagnosis not present

## 2017-12-12 DIAGNOSIS — Z0000 Encounter for general adult medical examination without abnormal findings: Secondary | ICD-10-CM | POA: Diagnosis not present

## 2017-12-12 DIAGNOSIS — I10 Essential (primary) hypertension: Secondary | ICD-10-CM | POA: Diagnosis not present

## 2018-03-27 DIAGNOSIS — L570 Actinic keratosis: Secondary | ICD-10-CM | POA: Diagnosis not present

## 2018-03-27 DIAGNOSIS — L578 Other skin changes due to chronic exposure to nonionizing radiation: Secondary | ICD-10-CM | POA: Diagnosis not present

## 2018-03-27 DIAGNOSIS — L298 Other pruritus: Secondary | ICD-10-CM | POA: Diagnosis not present

## 2018-05-23 DIAGNOSIS — H353122 Nonexudative age-related macular degeneration, left eye, intermediate dry stage: Secondary | ICD-10-CM | POA: Diagnosis not present

## 2018-05-23 DIAGNOSIS — H40003 Preglaucoma, unspecified, bilateral: Secondary | ICD-10-CM | POA: Diagnosis not present

## 2018-06-05 DIAGNOSIS — N183 Chronic kidney disease, stage 3 (moderate): Secondary | ICD-10-CM | POA: Diagnosis not present

## 2018-06-05 DIAGNOSIS — E7800 Pure hypercholesterolemia, unspecified: Secondary | ICD-10-CM | POA: Diagnosis not present

## 2018-06-12 DIAGNOSIS — K219 Gastro-esophageal reflux disease without esophagitis: Secondary | ICD-10-CM | POA: Diagnosis not present

## 2018-06-12 DIAGNOSIS — Z8673 Personal history of transient ischemic attack (TIA), and cerebral infarction without residual deficits: Secondary | ICD-10-CM | POA: Diagnosis not present

## 2018-06-12 DIAGNOSIS — F325 Major depressive disorder, single episode, in full remission: Secondary | ICD-10-CM | POA: Diagnosis not present

## 2018-06-12 DIAGNOSIS — E7800 Pure hypercholesterolemia, unspecified: Secondary | ICD-10-CM | POA: Diagnosis not present

## 2018-06-12 DIAGNOSIS — N183 Chronic kidney disease, stage 3 (moderate): Secondary | ICD-10-CM | POA: Diagnosis not present

## 2018-06-12 DIAGNOSIS — I10 Essential (primary) hypertension: Secondary | ICD-10-CM | POA: Diagnosis not present

## 2018-07-06 DIAGNOSIS — H40003 Preglaucoma, unspecified, bilateral: Secondary | ICD-10-CM | POA: Diagnosis not present

## 2018-09-25 DIAGNOSIS — L570 Actinic keratosis: Secondary | ICD-10-CM | POA: Diagnosis not present

## 2018-09-25 DIAGNOSIS — D1800 Hemangioma unspecified site: Secondary | ICD-10-CM | POA: Diagnosis not present

## 2018-09-25 DIAGNOSIS — L821 Other seborrheic keratosis: Secondary | ICD-10-CM | POA: Diagnosis not present

## 2018-09-25 DIAGNOSIS — L578 Other skin changes due to chronic exposure to nonionizing radiation: Secondary | ICD-10-CM | POA: Diagnosis not present

## 2018-11-03 DIAGNOSIS — H40053 Ocular hypertension, bilateral: Secondary | ICD-10-CM | POA: Diagnosis not present

## 2018-12-07 DIAGNOSIS — N183 Chronic kidney disease, stage 3 (moderate): Secondary | ICD-10-CM | POA: Diagnosis not present

## 2018-12-07 DIAGNOSIS — E7800 Pure hypercholesterolemia, unspecified: Secondary | ICD-10-CM | POA: Diagnosis not present

## 2018-12-07 DIAGNOSIS — Z8673 Personal history of transient ischemic attack (TIA), and cerebral infarction without residual deficits: Secondary | ICD-10-CM | POA: Diagnosis not present

## 2018-12-07 DIAGNOSIS — I10 Essential (primary) hypertension: Secondary | ICD-10-CM | POA: Diagnosis not present

## 2018-12-07 DIAGNOSIS — K219 Gastro-esophageal reflux disease without esophagitis: Secondary | ICD-10-CM | POA: Diagnosis not present

## 2018-12-14 DIAGNOSIS — N183 Chronic kidney disease, stage 3 (moderate): Secondary | ICD-10-CM | POA: Diagnosis not present

## 2018-12-14 DIAGNOSIS — I10 Essential (primary) hypertension: Secondary | ICD-10-CM | POA: Diagnosis not present

## 2018-12-14 DIAGNOSIS — M1990 Unspecified osteoarthritis, unspecified site: Secondary | ICD-10-CM | POA: Diagnosis not present

## 2018-12-14 DIAGNOSIS — K219 Gastro-esophageal reflux disease without esophagitis: Secondary | ICD-10-CM | POA: Diagnosis not present

## 2018-12-14 DIAGNOSIS — F325 Major depressive disorder, single episode, in full remission: Secondary | ICD-10-CM | POA: Diagnosis not present

## 2018-12-14 DIAGNOSIS — E7800 Pure hypercholesterolemia, unspecified: Secondary | ICD-10-CM | POA: Diagnosis not present

## 2018-12-14 DIAGNOSIS — Z0000 Encounter for general adult medical examination without abnormal findings: Secondary | ICD-10-CM | POA: Diagnosis not present

## 2018-12-14 DIAGNOSIS — Z8673 Personal history of transient ischemic attack (TIA), and cerebral infarction without residual deficits: Secondary | ICD-10-CM | POA: Diagnosis not present

## 2019-04-02 DIAGNOSIS — L578 Other skin changes due to chronic exposure to nonionizing radiation: Secondary | ICD-10-CM | POA: Diagnosis not present

## 2019-04-02 DIAGNOSIS — L821 Other seborrheic keratosis: Secondary | ICD-10-CM | POA: Diagnosis not present

## 2019-04-02 DIAGNOSIS — L570 Actinic keratosis: Secondary | ICD-10-CM | POA: Diagnosis not present

## 2019-05-07 DIAGNOSIS — H40003 Preglaucoma, unspecified, bilateral: Secondary | ICD-10-CM | POA: Diagnosis not present

## 2019-05-10 DIAGNOSIS — H40003 Preglaucoma, unspecified, bilateral: Secondary | ICD-10-CM | POA: Diagnosis not present

## 2019-05-10 DIAGNOSIS — H353122 Nonexudative age-related macular degeneration, left eye, intermediate dry stage: Secondary | ICD-10-CM | POA: Diagnosis not present

## 2019-06-12 DIAGNOSIS — N183 Chronic kidney disease, stage 3 (moderate): Secondary | ICD-10-CM | POA: Diagnosis not present

## 2019-06-12 DIAGNOSIS — E7800 Pure hypercholesterolemia, unspecified: Secondary | ICD-10-CM | POA: Diagnosis not present

## 2019-06-12 DIAGNOSIS — I10 Essential (primary) hypertension: Secondary | ICD-10-CM | POA: Diagnosis not present

## 2019-06-19 DIAGNOSIS — R3911 Hesitancy of micturition: Secondary | ICD-10-CM | POA: Diagnosis not present

## 2019-06-19 DIAGNOSIS — F325 Major depressive disorder, single episode, in full remission: Secondary | ICD-10-CM | POA: Diagnosis not present

## 2019-06-19 DIAGNOSIS — N183 Chronic kidney disease, stage 3 (moderate): Secondary | ICD-10-CM | POA: Diagnosis not present

## 2019-06-19 DIAGNOSIS — Z8673 Personal history of transient ischemic attack (TIA), and cerebral infarction without residual deficits: Secondary | ICD-10-CM | POA: Diagnosis not present

## 2019-06-19 DIAGNOSIS — E7800 Pure hypercholesterolemia, unspecified: Secondary | ICD-10-CM | POA: Diagnosis not present

## 2019-06-19 DIAGNOSIS — K219 Gastro-esophageal reflux disease without esophagitis: Secondary | ICD-10-CM | POA: Diagnosis not present

## 2019-06-19 DIAGNOSIS — N401 Enlarged prostate with lower urinary tract symptoms: Secondary | ICD-10-CM | POA: Diagnosis not present

## 2019-06-19 DIAGNOSIS — I10 Essential (primary) hypertension: Secondary | ICD-10-CM | POA: Diagnosis not present

## 2019-09-21 DIAGNOSIS — R1032 Left lower quadrant pain: Secondary | ICD-10-CM | POA: Diagnosis not present

## 2019-09-21 DIAGNOSIS — I10 Essential (primary) hypertension: Secondary | ICD-10-CM | POA: Diagnosis not present

## 2019-09-21 DIAGNOSIS — Z87891 Personal history of nicotine dependence: Secondary | ICD-10-CM | POA: Diagnosis not present

## 2019-09-27 DIAGNOSIS — Z85828 Personal history of other malignant neoplasm of skin: Secondary | ICD-10-CM | POA: Diagnosis not present

## 2019-09-27 DIAGNOSIS — L821 Other seborrheic keratosis: Secondary | ICD-10-CM | POA: Diagnosis not present

## 2019-09-27 DIAGNOSIS — L578 Other skin changes due to chronic exposure to nonionizing radiation: Secondary | ICD-10-CM | POA: Diagnosis not present

## 2019-09-27 DIAGNOSIS — L570 Actinic keratosis: Secondary | ICD-10-CM | POA: Diagnosis not present

## 2019-11-13 DIAGNOSIS — H353122 Nonexudative age-related macular degeneration, left eye, intermediate dry stage: Secondary | ICD-10-CM | POA: Diagnosis not present

## 2020-02-04 DIAGNOSIS — I10 Essential (primary) hypertension: Secondary | ICD-10-CM | POA: Diagnosis not present

## 2020-02-04 DIAGNOSIS — N183 Chronic kidney disease, stage 3 unspecified: Secondary | ICD-10-CM | POA: Diagnosis not present

## 2020-02-04 DIAGNOSIS — E7800 Pure hypercholesterolemia, unspecified: Secondary | ICD-10-CM | POA: Diagnosis not present

## 2020-02-11 DIAGNOSIS — E785 Hyperlipidemia, unspecified: Secondary | ICD-10-CM | POA: Diagnosis not present

## 2020-02-11 DIAGNOSIS — Z0000 Encounter for general adult medical examination without abnormal findings: Secondary | ICD-10-CM | POA: Diagnosis not present

## 2020-02-11 DIAGNOSIS — N183 Chronic kidney disease, stage 3 unspecified: Secondary | ICD-10-CM | POA: Diagnosis not present

## 2020-02-11 DIAGNOSIS — I129 Hypertensive chronic kidney disease with stage 1 through stage 4 chronic kidney disease, or unspecified chronic kidney disease: Secondary | ICD-10-CM | POA: Diagnosis not present

## 2020-02-11 DIAGNOSIS — N281 Cyst of kidney, acquired: Secondary | ICD-10-CM | POA: Diagnosis not present

## 2020-02-11 DIAGNOSIS — Z8673 Personal history of transient ischemic attack (TIA), and cerebral infarction without residual deficits: Secondary | ICD-10-CM | POA: Diagnosis not present

## 2020-02-11 DIAGNOSIS — K30 Functional dyspepsia: Secondary | ICD-10-CM | POA: Diagnosis not present

## 2020-02-11 DIAGNOSIS — K4090 Unilateral inguinal hernia, without obstruction or gangrene, not specified as recurrent: Secondary | ICD-10-CM | POA: Diagnosis not present

## 2020-02-11 DIAGNOSIS — F329 Major depressive disorder, single episode, unspecified: Secondary | ICD-10-CM | POA: Diagnosis not present

## 2020-03-27 ENCOUNTER — Ambulatory Visit: Payer: Medicare HMO | Admitting: Dermatology

## 2020-03-27 ENCOUNTER — Other Ambulatory Visit: Payer: Self-pay

## 2020-03-27 DIAGNOSIS — Z1283 Encounter for screening for malignant neoplasm of skin: Secondary | ICD-10-CM | POA: Diagnosis not present

## 2020-03-27 DIAGNOSIS — L578 Other skin changes due to chronic exposure to nonionizing radiation: Secondary | ICD-10-CM

## 2020-03-27 DIAGNOSIS — Z85828 Personal history of other malignant neoplasm of skin: Secondary | ICD-10-CM

## 2020-03-27 DIAGNOSIS — D229 Melanocytic nevi, unspecified: Secondary | ICD-10-CM | POA: Diagnosis not present

## 2020-03-27 DIAGNOSIS — L814 Other melanin hyperpigmentation: Secondary | ICD-10-CM

## 2020-03-27 DIAGNOSIS — L570 Actinic keratosis: Secondary | ICD-10-CM

## 2020-03-27 DIAGNOSIS — L821 Other seborrheic keratosis: Secondary | ICD-10-CM

## 2020-03-27 DIAGNOSIS — D1800 Hemangioma unspecified site: Secondary | ICD-10-CM | POA: Diagnosis not present

## 2020-03-27 NOTE — Progress Notes (Signed)
   Follow-Up Visit   Subjective  Edward Winters is a 86 y.o. male who presents for the following: Annual Exam (Hx of BCC and AK's - no new or changing lesions per patient). The patient presents for upper body skin exam for skin cancer screening and mole check.  The following portions of the chart were reviewed this encounter and updated as appropriate:  Tobacco  Allergies  Meds  Problems  Med Hx  Surg Hx  Fam Hx     Review of Systems:  No other skin or systemic complaints except as noted in HPI or Assessment and Plan.  Objective  Well appearing patient in no apparent distress; mood and affect are within normal limits.  A full examination was performed including scalp, head, eyes, ears, nose, lips, neck, chest, axillae, abdomen, back, buttocks, bilateral upper extremities, bilateral lower extremities, hands, feet, fingers, toes, fingernails, and toenails. All findings within normal limits unless otherwise noted below.  Objective  L forehead x 1, scalp x 14 (15): Erythematous thin papules/macules with gritty scale.    Assessment & Plan    AK (actinic keratosis) (15) L forehead x 1, scalp x 14  Destruction of lesion - L forehead x 1, scalp x 14 Complexity: simple   Destruction method: cryotherapy   Informed consent: discussed and consent obtained   Timeout:  patient name, date of birth, surgical site, and procedure verified Lesion destroyed using liquid nitrogen: Yes   Region frozen until ice ball extended beyond lesion: Yes   Outcome: patient tolerated procedure well with no complications   Post-procedure details: wound care instructions given    Seborrheic Keratoses - Stuck-on, waxy, tan-brown papules and plaques  - Discussed benign etiology and prognosis. - Observe - Call for any changes  Lentigines - Scattered tan macules - Discussed due to sun exposure - Benign, observe - Call for any changes  Hemangiomas - Red papules - Discussed benign nature - Observe -  Call for any changes  Melanocytic Nevi - Tan-brown and/or pink-flesh-colored symmetric macules and papules - Benign appearing on exam today - Observation - Call clinic for new or changing moles - Recommend daily use of broad spectrum spf 30+ sunscreen to sun-exposed areas.   Actinic Damage - diffuse scaly erythematous macules with underlying dyspigmentation - Recommend daily broad spectrum sunscreen SPF 30+ to sun-exposed areas, reapply every 2 hours as needed.  - Call for new or changing lesions.  History of Basal Cell Carcinoma of the Skin - No evidence of recurrence today - Recommend regular full body skin exams - Recommend daily broad spectrum sunscreen SPF 30+ to sun-exposed areas, reapply every 2 hours as needed.  - Call if any new or changing lesions are noted between office visits  Return in about 6 months (around 09/26/2020).  Luther Redo, CMA, am acting as scribe for Sarina Ser, MD .   Documentation: I have reviewed the above documentation for accuracy and completeness, and I agree with the above.  Sarina Ser, MD

## 2020-03-28 ENCOUNTER — Encounter: Payer: Self-pay | Admitting: Dermatology

## 2020-05-13 DIAGNOSIS — H40003 Preglaucoma, unspecified, bilateral: Secondary | ICD-10-CM | POA: Diagnosis not present

## 2020-05-20 DIAGNOSIS — H40003 Preglaucoma, unspecified, bilateral: Secondary | ICD-10-CM | POA: Diagnosis not present

## 2020-06-06 DIAGNOSIS — J029 Acute pharyngitis, unspecified: Secondary | ICD-10-CM | POA: Diagnosis not present

## 2020-06-06 DIAGNOSIS — R0982 Postnasal drip: Secondary | ICD-10-CM | POA: Diagnosis not present

## 2020-06-06 DIAGNOSIS — Z87891 Personal history of nicotine dependence: Secondary | ICD-10-CM | POA: Diagnosis not present

## 2020-08-07 DIAGNOSIS — N1831 Chronic kidney disease, stage 3a: Secondary | ICD-10-CM | POA: Diagnosis not present

## 2020-08-07 DIAGNOSIS — E7800 Pure hypercholesterolemia, unspecified: Secondary | ICD-10-CM | POA: Diagnosis not present

## 2020-08-07 DIAGNOSIS — I10 Essential (primary) hypertension: Secondary | ICD-10-CM | POA: Diagnosis not present

## 2020-08-07 DIAGNOSIS — K219 Gastro-esophageal reflux disease without esophagitis: Secondary | ICD-10-CM | POA: Diagnosis not present

## 2020-08-14 DIAGNOSIS — N183 Chronic kidney disease, stage 3 unspecified: Secondary | ICD-10-CM | POA: Diagnosis not present

## 2020-08-14 DIAGNOSIS — I129 Hypertensive chronic kidney disease with stage 1 through stage 4 chronic kidney disease, or unspecified chronic kidney disease: Secondary | ICD-10-CM | POA: Diagnosis not present

## 2020-08-14 DIAGNOSIS — F329 Major depressive disorder, single episode, unspecified: Secondary | ICD-10-CM | POA: Diagnosis not present

## 2020-08-14 DIAGNOSIS — Z7982 Long term (current) use of aspirin: Secondary | ICD-10-CM | POA: Diagnosis not present

## 2020-08-14 DIAGNOSIS — E785 Hyperlipidemia, unspecified: Secondary | ICD-10-CM | POA: Diagnosis not present

## 2020-08-28 DIAGNOSIS — I10 Essential (primary) hypertension: Secondary | ICD-10-CM | POA: Diagnosis not present

## 2020-09-25 ENCOUNTER — Other Ambulatory Visit: Payer: Self-pay

## 2020-09-25 ENCOUNTER — Ambulatory Visit: Payer: Medicare HMO | Admitting: Dermatology

## 2020-09-25 DIAGNOSIS — L821 Other seborrheic keratosis: Secondary | ICD-10-CM | POA: Diagnosis not present

## 2020-09-25 DIAGNOSIS — L578 Other skin changes due to chronic exposure to nonionizing radiation: Secondary | ICD-10-CM

## 2020-09-25 DIAGNOSIS — Z85828 Personal history of other malignant neoplasm of skin: Secondary | ICD-10-CM | POA: Diagnosis not present

## 2020-09-25 DIAGNOSIS — L820 Inflamed seborrheic keratosis: Secondary | ICD-10-CM

## 2020-09-25 DIAGNOSIS — L570 Actinic keratosis: Secondary | ICD-10-CM

## 2020-09-25 NOTE — Progress Notes (Signed)
   Follow-Up Visit   Subjective  Edward Winters is a 86 y.o. male who presents for the following: Actinic Keratosis (6 months f/u on Aks on face, scalp, pink scaly spots on face ) patient also has history of basal cell carcinoma.  The following portions of the chart were reviewed this encounter and updated as appropriate:  Tobacco  Allergies  Meds  Problems  Med Hx  Surg Hx  Fam Hx     Review of Systems:  No other skin or systemic complaints except as noted in HPI or Assessment and Plan.  Objective  Well appearing patient in no apparent distress; mood and affect are within normal limits.  A focused examination was performed including face, scalp. Relevant physical exam findings are noted in the Assessment and Plan.  Objective  scalp (11): Erythematous thin papules/macules with gritty scale.   Objective  L temple x 1: Erythematous keratotic or waxy stuck-on papule or plaque.    Assessment & Plan  AK (actinic keratosis) (11) scalp  Plan on PDT on scalp in 1 month  Sample of Cerave with SPF given  Destruction of lesion - scalp Complexity: simple   Destruction method: cryotherapy   Informed consent: discussed and consent obtained   Timeout:  patient name, date of birth, surgical site, and procedure verified Lesion destroyed using liquid nitrogen: Yes   Region frozen until ice ball extended beyond lesion: Yes   Outcome: patient tolerated procedure well with no complications   Post-procedure details: wound care instructions given    Inflamed seborrheic keratosis L temple x 1  Destruction of lesion - L temple x 1 Complexity: simple   Destruction method: cryotherapy   Informed consent: discussed and consent obtained   Timeout:  patient name, date of birth, surgical site, and procedure verified Lesion destroyed using liquid nitrogen: Yes   Region frozen until ice ball extended beyond lesion: Yes   Outcome: patient tolerated procedure well with no complications     Post-procedure details: wound care instructions given    Actinic Damage - diffuse scaly erythematous macules with underlying dyspigmentation - Recommend daily broad spectrum sunscreen SPF 30+ to sun-exposed areas, reapply every 2 hours as needed.  - Call for new or changing lesions.  Seborrheic Keratoses - Stuck-on, waxy, tan-brown papules and plaques  - Discussed benign etiology and prognosis. - Observe - Call for any changes   Return in about 6 months (around 03/26/2021) for AKs, 1 month PDT .  IMarye Round, CMA, am acting as scribe for Sarina Ser, MD .  Documentation: I have reviewed the above documentation for accuracy and completeness, and I agree with the above.  Sarina Ser, MD

## 2020-09-25 NOTE — Patient Instructions (Addendum)
Levulan/PDT Treatment Common Side Effects 1 month on scalp  - Burning/stinging, which may be severe and last up to 24-72 hours after your treatment  - Redness, swelling and/or peeling which may last up to 4 weeks  - Scaling/crusting which may last up to 2 weeks  - Sun sensitivity (you MUST avoid sun exposure for 48-72 hours after treatment)  Care Instructions  - Okay to wash with soap and water and shampoo as normal  - If needed, you can do a cold compress (ex. Ice packs) for comfort  - If okay with your Primary Doctor, you may use analgesics such as Tylenol every 4-6 hours, not to exceed recommended dose  - You may apply Cerave Healing Ointment, Vaseline or Aquaphor  - If you have a lot of swelling you may take a Benadryl to help with this (this may cause drowsiness)  Sun Precautions  - Wear a wide brim hat for the next week if outside  - Wear a sunblock with zinc or titanium dioxide at least SPF 50 daily   We will recheck you in 10-12 weeks. If any problems, please call the office and ask to speak with a nurse.

## 2020-09-26 ENCOUNTER — Encounter: Payer: Self-pay | Admitting: Dermatology

## 2020-10-27 ENCOUNTER — Other Ambulatory Visit: Payer: Self-pay

## 2020-10-27 ENCOUNTER — Ambulatory Visit (INDEPENDENT_AMBULATORY_CARE_PROVIDER_SITE_OTHER): Payer: Medicare HMO

## 2020-10-27 DIAGNOSIS — L570 Actinic keratosis: Secondary | ICD-10-CM | POA: Diagnosis not present

## 2020-10-27 MED ORDER — AMINOLEVULINIC ACID HCL 20 % EX SOLR
1.0000 "application " | Freq: Once | CUTANEOUS | Status: AC
  Administered 2020-10-27: 354 mg via TOPICAL

## 2020-10-27 NOTE — Patient Instructions (Signed)

## 2020-10-27 NOTE — Progress Notes (Signed)
1. AK (actinic keratosis) Scalp  Photodynamic therapy - Scalp Procedure discussed: discussed risks, benefits, side effects. and alternatives   Prep: site scrubbed/prepped with acetone   Location:  Scalp Number of lesions:  Multiple Type of treatment:  Blue light Aminolevulinic Acid (see MAR for details): Levulan Incubation time (minutes):  120 Number of minutes under lamp:  16 Number of seconds under lamp:  40 Cooling:  Floor fan Outcome: patient tolerated procedure well with no complications   Post-procedure details: sunscreen applied and aftercare instructions given to patient

## 2020-11-18 DIAGNOSIS — H40053 Ocular hypertension, bilateral: Secondary | ICD-10-CM | POA: Diagnosis not present

## 2021-02-04 DIAGNOSIS — K219 Gastro-esophageal reflux disease without esophagitis: Secondary | ICD-10-CM | POA: Diagnosis not present

## 2021-02-04 DIAGNOSIS — E7800 Pure hypercholesterolemia, unspecified: Secondary | ICD-10-CM | POA: Diagnosis not present

## 2021-02-04 DIAGNOSIS — I10 Essential (primary) hypertension: Secondary | ICD-10-CM | POA: Diagnosis not present

## 2021-02-04 DIAGNOSIS — Z7982 Long term (current) use of aspirin: Secondary | ICD-10-CM | POA: Diagnosis not present

## 2021-02-04 DIAGNOSIS — N1831 Chronic kidney disease, stage 3a: Secondary | ICD-10-CM | POA: Diagnosis not present

## 2021-02-11 DIAGNOSIS — N1831 Chronic kidney disease, stage 3a: Secondary | ICD-10-CM | POA: Diagnosis not present

## 2021-02-11 DIAGNOSIS — M1990 Unspecified osteoarthritis, unspecified site: Secondary | ICD-10-CM | POA: Diagnosis not present

## 2021-02-11 DIAGNOSIS — E7800 Pure hypercholesterolemia, unspecified: Secondary | ICD-10-CM | POA: Diagnosis not present

## 2021-02-11 DIAGNOSIS — Z0000 Encounter for general adult medical examination without abnormal findings: Secondary | ICD-10-CM | POA: Diagnosis not present

## 2021-02-11 DIAGNOSIS — F325 Major depressive disorder, single episode, in full remission: Secondary | ICD-10-CM | POA: Diagnosis not present

## 2021-02-11 DIAGNOSIS — I129 Hypertensive chronic kidney disease with stage 1 through stage 4 chronic kidney disease, or unspecified chronic kidney disease: Secondary | ICD-10-CM | POA: Diagnosis not present

## 2021-03-26 ENCOUNTER — Other Ambulatory Visit: Payer: Self-pay

## 2021-03-26 ENCOUNTER — Encounter: Payer: Self-pay | Admitting: Dermatology

## 2021-03-26 ENCOUNTER — Ambulatory Visit: Payer: Medicare HMO | Admitting: Dermatology

## 2021-03-26 DIAGNOSIS — L570 Actinic keratosis: Secondary | ICD-10-CM

## 2021-03-26 DIAGNOSIS — L821 Other seborrheic keratosis: Secondary | ICD-10-CM | POA: Diagnosis not present

## 2021-03-26 DIAGNOSIS — Z85828 Personal history of other malignant neoplasm of skin: Secondary | ICD-10-CM | POA: Diagnosis not present

## 2021-03-26 DIAGNOSIS — L578 Other skin changes due to chronic exposure to nonionizing radiation: Secondary | ICD-10-CM

## 2021-03-26 NOTE — Progress Notes (Signed)
   Follow-Up Visit   Subjective  Edward Winters is a 87 y.o. male who presents for the following: Actinic Keratosis (S/P PDT on the face - check for persistent skin lesions).  The following portions of the chart were reviewed this encounter and updated as appropriate:   Tobacco  Allergies  Meds  Problems  Med Hx  Surg Hx  Fam Hx     Review of Systems:  No other skin or systemic complaints except as noted in HPI or Assessment and Plan.  Objective  Well appearing patient in no apparent distress; mood and affect are within normal limits.  A focused examination was performed including the face. Relevant physical exam findings are noted in the Assessment and Plan.  Objective  Scalp x 12 (12): Erythematous thin papules/macules with gritty scale.    Assessment & Plan  AK (actinic keratosis) (12) Scalp x 12  Destruction of lesion - Scalp x 12 Complexity: simple   Destruction method: cryotherapy   Informed consent: discussed and consent obtained   Timeout:  patient name, date of birth, surgical site, and procedure verified Lesion destroyed using liquid nitrogen: Yes   Region frozen until ice ball extended beyond lesion: Yes   Outcome: patient tolerated procedure well with no complications   Post-procedure details: wound care instructions given     Seborrheic Keratoses - Stuck-on, waxy, tan-brown papules and/or plaques  - Benign-appearing - Discussed benign etiology and prognosis. - Observe - Call for any changes  Actinic Damage - chronic, secondary to cumulative UV radiation exposure/sun exposure over time - diffuse scaly erythematous macules with underlying dyspigmentation - Recommend daily broad spectrum sunscreen SPF 30+ to sun-exposed areas, reapply every 2 hours as needed.  - Recommend staying in the shade or wearing long sleeves, sun glasses (UVA+UVB protection) and wide brim hats (4-inch brim around the entire circumference of the hat). - Call for new or changing  lesions.  History of Basal Cell Carcinoma of the Skin - No evidence of recurrence today - Recommend regular full body skin exams - Recommend daily broad spectrum sunscreen SPF 30+ to sun-exposed areas, reapply every 2 hours as needed.  - Call if any new or changing lesions are noted between office visits  Return in about 6 months (around 09/25/2021) for UBSE.  Luther Redo, CMA, am acting as scribe for Sarina Ser, MD .  Documentation: I have reviewed the above documentation for accuracy and completeness, and I agree with the above.  Sarina Ser, MD

## 2021-03-26 NOTE — Patient Instructions (Signed)

## 2021-03-30 ENCOUNTER — Encounter: Payer: Self-pay | Admitting: Dermatology

## 2021-04-02 DIAGNOSIS — Z113 Encounter for screening for infections with a predominantly sexual mode of transmission: Secondary | ICD-10-CM | POA: Diagnosis not present

## 2021-04-02 DIAGNOSIS — F0150 Vascular dementia without behavioral disturbance: Secondary | ICD-10-CM | POA: Diagnosis not present

## 2021-04-02 DIAGNOSIS — Z79899 Other long term (current) drug therapy: Secondary | ICD-10-CM | POA: Diagnosis not present

## 2021-04-02 DIAGNOSIS — E559 Vitamin D deficiency, unspecified: Secondary | ICD-10-CM | POA: Diagnosis not present

## 2021-04-02 DIAGNOSIS — G309 Alzheimer's disease, unspecified: Secondary | ICD-10-CM | POA: Diagnosis not present

## 2021-04-02 DIAGNOSIS — F0280 Dementia in other diseases classified elsewhere without behavioral disturbance: Secondary | ICD-10-CM | POA: Diagnosis not present

## 2021-04-02 DIAGNOSIS — H9190 Unspecified hearing loss, unspecified ear: Secondary | ICD-10-CM | POA: Diagnosis not present

## 2021-04-02 DIAGNOSIS — E538 Deficiency of other specified B group vitamins: Secondary | ICD-10-CM | POA: Diagnosis not present

## 2021-04-18 DIAGNOSIS — F0280 Dementia in other diseases classified elsewhere without behavioral disturbance: Secondary | ICD-10-CM | POA: Diagnosis not present

## 2021-04-18 DIAGNOSIS — F0150 Vascular dementia without behavioral disturbance: Secondary | ICD-10-CM | POA: Diagnosis not present

## 2021-04-18 DIAGNOSIS — I739 Peripheral vascular disease, unspecified: Secondary | ICD-10-CM | POA: Diagnosis not present

## 2021-04-18 DIAGNOSIS — G309 Alzheimer's disease, unspecified: Secondary | ICD-10-CM | POA: Diagnosis not present

## 2021-05-06 DIAGNOSIS — H903 Sensorineural hearing loss, bilateral: Secondary | ICD-10-CM | POA: Diagnosis not present

## 2021-05-06 DIAGNOSIS — J300 Vasomotor rhinitis: Secondary | ICD-10-CM | POA: Diagnosis not present

## 2021-05-06 DIAGNOSIS — H90A31 Mixed conductive and sensorineural hearing loss, unilateral, right ear with restricted hearing on the contralateral side: Secondary | ICD-10-CM | POA: Diagnosis not present

## 2021-05-19 DIAGNOSIS — H40003 Preglaucoma, unspecified, bilateral: Secondary | ICD-10-CM | POA: Diagnosis not present

## 2021-05-26 DIAGNOSIS — H353122 Nonexudative age-related macular degeneration, left eye, intermediate dry stage: Secondary | ICD-10-CM | POA: Diagnosis not present

## 2021-06-03 DIAGNOSIS — F0150 Vascular dementia without behavioral disturbance: Secondary | ICD-10-CM | POA: Diagnosis not present

## 2021-06-03 DIAGNOSIS — G309 Alzheimer's disease, unspecified: Secondary | ICD-10-CM | POA: Diagnosis not present

## 2021-06-03 DIAGNOSIS — E538 Deficiency of other specified B group vitamins: Secondary | ICD-10-CM | POA: Diagnosis not present

## 2021-06-03 DIAGNOSIS — Z79899 Other long term (current) drug therapy: Secondary | ICD-10-CM | POA: Diagnosis not present

## 2021-06-03 DIAGNOSIS — H9190 Unspecified hearing loss, unspecified ear: Secondary | ICD-10-CM | POA: Diagnosis not present

## 2021-06-03 DIAGNOSIS — F0280 Dementia in other diseases classified elsewhere without behavioral disturbance: Secondary | ICD-10-CM | POA: Diagnosis not present

## 2021-06-08 DIAGNOSIS — R1032 Left lower quadrant pain: Secondary | ICD-10-CM | POA: Diagnosis not present

## 2021-06-08 DIAGNOSIS — K4090 Unilateral inguinal hernia, without obstruction or gangrene, not specified as recurrent: Secondary | ICD-10-CM | POA: Diagnosis not present

## 2021-06-08 DIAGNOSIS — I10 Essential (primary) hypertension: Secondary | ICD-10-CM | POA: Diagnosis not present

## 2021-08-07 DIAGNOSIS — I10 Essential (primary) hypertension: Secondary | ICD-10-CM | POA: Diagnosis not present

## 2021-08-07 DIAGNOSIS — K219 Gastro-esophageal reflux disease without esophagitis: Secondary | ICD-10-CM | POA: Diagnosis not present

## 2021-08-07 DIAGNOSIS — N1831 Chronic kidney disease, stage 3a: Secondary | ICD-10-CM | POA: Diagnosis not present

## 2021-08-07 DIAGNOSIS — E7800 Pure hypercholesterolemia, unspecified: Secondary | ICD-10-CM | POA: Diagnosis not present

## 2021-08-14 DIAGNOSIS — F325 Major depressive disorder, single episode, in full remission: Secondary | ICD-10-CM | POA: Diagnosis not present

## 2021-08-14 DIAGNOSIS — F0280 Dementia in other diseases classified elsewhere without behavioral disturbance: Secondary | ICD-10-CM | POA: Diagnosis not present

## 2021-08-14 DIAGNOSIS — I129 Hypertensive chronic kidney disease with stage 1 through stage 4 chronic kidney disease, or unspecified chronic kidney disease: Secondary | ICD-10-CM | POA: Diagnosis not present

## 2021-08-14 DIAGNOSIS — N1831 Chronic kidney disease, stage 3a: Secondary | ICD-10-CM | POA: Diagnosis not present

## 2021-08-14 DIAGNOSIS — G309 Alzheimer's disease, unspecified: Secondary | ICD-10-CM | POA: Diagnosis not present

## 2021-08-14 DIAGNOSIS — K219 Gastro-esophageal reflux disease without esophagitis: Secondary | ICD-10-CM | POA: Diagnosis not present

## 2021-08-14 DIAGNOSIS — F0150 Vascular dementia without behavioral disturbance: Secondary | ICD-10-CM | POA: Diagnosis not present

## 2021-08-14 DIAGNOSIS — E7800 Pure hypercholesterolemia, unspecified: Secondary | ICD-10-CM | POA: Diagnosis not present

## 2021-09-21 ENCOUNTER — Ambulatory Visit: Payer: Medicare HMO | Admitting: Dermatology

## 2021-09-21 ENCOUNTER — Other Ambulatory Visit: Payer: Self-pay

## 2021-09-21 DIAGNOSIS — Z1283 Encounter for screening for malignant neoplasm of skin: Secondary | ICD-10-CM

## 2021-09-21 DIAGNOSIS — L578 Other skin changes due to chronic exposure to nonionizing radiation: Secondary | ICD-10-CM | POA: Diagnosis not present

## 2021-09-21 DIAGNOSIS — D229 Melanocytic nevi, unspecified: Secondary | ICD-10-CM | POA: Diagnosis not present

## 2021-09-21 DIAGNOSIS — D1801 Hemangioma of skin and subcutaneous tissue: Secondary | ICD-10-CM | POA: Diagnosis not present

## 2021-09-21 DIAGNOSIS — L820 Inflamed seborrheic keratosis: Secondary | ICD-10-CM | POA: Diagnosis not present

## 2021-09-21 DIAGNOSIS — L814 Other melanin hyperpigmentation: Secondary | ICD-10-CM

## 2021-09-21 DIAGNOSIS — L821 Other seborrheic keratosis: Secondary | ICD-10-CM

## 2021-09-21 NOTE — Patient Instructions (Signed)
Cryotherapy Aftercare  Wash gently with soap and water everyday.   Apply Vaseline and Band-Aid daily until healed.   Melanoma ABCDEs  Melanoma is the most dangerous type of skin cancer, and is the leading cause of death from skin disease.  You are more likely to develop melanoma if you: Have light-colored skin, light-colored eyes, or red or blond hair Spend a lot of time in the sun Tan regularly, either outdoors or in a tanning bed Have had blistering sunburns, especially during childhood Have a close family member who has had a melanoma Have atypical moles or large birthmarks  Early detection of melanoma is key since treatment is typically straightforward and cure rates are extremely high if we catch it early.   The first sign of melanoma is often a change in a mole or a new dark spot.  The ABCDE system is a way of remembering the signs of melanoma.  A for asymmetry:  The two halves do not match. B for border:  The edges of the growth are irregular. C for color:  A mixture of colors are present instead of an even brown color. D for diameter:  Melanomas are usually (but not always) greater than 48mm - the size of a pencil eraser. E for evolution:  The spot keeps changing in size, shape, and color.  Please check your skin once per month between visits. You can use a small mirror in front and a large mirror behind you to keep an eye on the back side or your body.   If you see any new or changing lesions before your next follow-up, please call to schedule a visit.  Please continue daily skin protection including broad spectrum sunscreen SPF 30+ to sun-exposed areas, reapplying every 2 hours as needed when you're outdoors.   Staying in the shade or wearing long sleeves, sun glasses (UVA+UVB protection) and wide brim hats (4-inch brim around the entire circumference of the hat) are also recommended for sun protection.    If you have any questions or concerns for your doctor, please call  our main line at [phone] and press option 4 to reach your doctor's medical assistant. If no one answers, please leave a voicemail as directed and we will return your call as soon as possible. Messages left after 4 pm will be answered the following business day.   You may also send [place] a message via [place]. We typically respond to MyChart messages within 1-2 business days.  For prescription refills, please ask your pharmacy to contact our office. Our fax number is [phone].  If you have an urgent issue when the clinic is closed that cannot wait until the next business day, you can page your doctor at the number below.    Please note that while we do our best to be available for urgent issues outside of office hours, we are not available 24/7.   If you have an urgent issue and are unable to reach [place], you may choose to seek medical care at your doctor's office, retail clinic, urgent care center, or emergency room.  If you have a medical emergency, please immediately call 911 or go to the emergency department.  Pager Numbers  - Dr. Nehemiah Massed: [phone]  - Dr. Laurence Ferrari: [phone]  - Dr. Nicole Kindred: [phone]  In the event of inclement weather, please call our main line at [phone] for an update on the status of any delays or closures.  Dermatology Medication Tips: Please keep the boxes that topical  medications come in in order to help keep track of the instructions about where and how to use these. Pharmacies typically print the medication instructions only on the boxes and not directly on the medication tubes.   If your medication is too expensive, please contact our office at [phone] option 4 or send [place] a message through [place].   We are unable to tell what your co-pay for medications will be in advance as this is different depending on your insurance coverage. However, we may be able to find a substitute medication at lower cost or fill out paperwork to get insurance to  cover a needed medication.   If a prior authorization is required to get your medication covered by your insurance company, please allow [place] 1-2 business days to complete this process.  Drug prices often vary depending on where the prescription is filled and some pharmacies may offer cheaper prices.  The website www.goodrx.com contains coupons for medications through different pharmacies. The prices here do not account for what the cost may be with help from insurance (it may be cheaper with your insurance), but the website can give you the price if you did not use any insurance.  - You can print the associated coupon and take it with your prescription to the pharmacy.  - You may also stop by our office during regular business hours and pick up a GoodRx coupon card.  - If you need your prescription sent electronically to a different pharmacy, notify our office through [facility] or by phone at [phone] option 4.

## 2021-09-21 NOTE — Progress Notes (Signed)
   Follow-Up Visit   Subjective  Edward Winters is a 87 y.o. male who presents for the following: Follow-up (Patient here today for upper body exam. Patient reports a spot at back of scalp and spot at left side of face. ). Patient here for full body skin exam and skin cancer screening.  The following portions of the chart were reviewed this encounter and updated as appropriate:  Tobacco  Allergies  Meds  Problems  Med Hx  Surg Hx  Fam Hx     Review of Systems: No other skin or systemic complaints except as noted in HPI or Assessment and Plan.  Objective  Well appearing patient in no apparent distress; mood and affect are within normal limits.  A focused examination was performed including upper extremities, including the arms, hands, fingers, and fingernails. Relevant physical exam findings are noted in the Assessment and Plan.  left posterior scalp x 1 and left cheek x 1 (2) Erythematous keratotic or waxy stuck-on papule or plaque.   Assessment & Plan  Inflamed seborrheic keratosis left posterior scalp x 1 and left cheek x 1  Destruction of lesion - left posterior scalp x 1 and left cheek x 1 Complexity: simple   Destruction method: cryotherapy   Informed consent: discussed and consent obtained   Timeout:  patient name, date of birth, surgical site, and procedure verified Lesion destroyed using liquid nitrogen: Yes   Region frozen until ice ball extended beyond lesion: Yes   Outcome: patient tolerated procedure well with no complications   Post-procedure details: wound care instructions given    Lentigines - Scattered tan macules - Due to sun exposure - Benign-appearing, observe - Recommend daily broad spectrum sunscreen SPF 30+ to sun-exposed areas, reapply every 2 hours as needed. - Call for any changes  Seborrheic Keratoses - Stuck-on, waxy, tan-brown papules and/or plaques  - Benign-appearing - Discussed benign etiology and prognosis. - Observe - Call for any  changes  Melanocytic Nevi - Tan-brown and/or pink-flesh-colored symmetric macules and papules - Benign appearing on exam today - Observation - Call clinic for new or changing moles - Recommend daily use of broad spectrum spf 30+ sunscreen to sun-exposed areas.   Hemangiomas - Red papules - Discussed benign nature - Observe - Call for any changes  Actinic Damage - Chronic condition, secondary to cumulative UV/sun exposure - diffuse scaly erythematous macules with underlying dyspigmentation - Recommend daily broad spectrum sunscreen SPF 30+ to sun-exposed areas, reapply every 2 hours as needed.  - Staying in the shade or wearing long sleeves, sun glasses (UVA+UVB protection) and wide brim hats (4-inch brim around the entire circumference of the hat) are also recommended for sun protection.  - Call for new or changing lesions.  Skin cancer screening performed today.  Return for 1 year tbse . IRuthell Rummage, CMA, am acting as scribe for Sarina Ser, MD. Documentation: I have reviewed the above documentation for accuracy and completeness, and I agree with the above.  Sarina Ser, MD

## 2021-09-23 ENCOUNTER — Encounter: Payer: Self-pay | Admitting: Dermatology

## 2021-10-05 DIAGNOSIS — F0280 Dementia in other diseases classified elsewhere without behavioral disturbance: Secondary | ICD-10-CM | POA: Diagnosis not present

## 2021-10-05 DIAGNOSIS — G309 Alzheimer's disease, unspecified: Secondary | ICD-10-CM | POA: Diagnosis not present

## 2021-10-05 DIAGNOSIS — H9190 Unspecified hearing loss, unspecified ear: Secondary | ICD-10-CM | POA: Diagnosis not present

## 2021-10-05 DIAGNOSIS — F0150 Vascular dementia without behavioral disturbance: Secondary | ICD-10-CM | POA: Diagnosis not present

## 2021-11-24 DIAGNOSIS — H524 Presbyopia: Secondary | ICD-10-CM | POA: Diagnosis not present

## 2021-11-24 DIAGNOSIS — H353213 Exudative age-related macular degeneration, right eye, with inactive scar: Secondary | ICD-10-CM | POA: Diagnosis not present

## 2022-02-04 DIAGNOSIS — E538 Deficiency of other specified B group vitamins: Secondary | ICD-10-CM | POA: Diagnosis not present

## 2022-02-04 DIAGNOSIS — N1831 Chronic kidney disease, stage 3a: Secondary | ICD-10-CM | POA: Diagnosis not present

## 2022-02-04 DIAGNOSIS — E7800 Pure hypercholesterolemia, unspecified: Secondary | ICD-10-CM | POA: Diagnosis not present

## 2022-02-11 DIAGNOSIS — G309 Alzheimer's disease, unspecified: Secondary | ICD-10-CM | POA: Diagnosis not present

## 2022-02-11 DIAGNOSIS — F0150 Vascular dementia without behavioral disturbance: Secondary | ICD-10-CM | POA: Diagnosis not present

## 2022-02-11 DIAGNOSIS — N1831 Chronic kidney disease, stage 3a: Secondary | ICD-10-CM | POA: Diagnosis not present

## 2022-02-11 DIAGNOSIS — Z1389 Encounter for screening for other disorder: Secondary | ICD-10-CM | POA: Diagnosis not present

## 2022-02-11 DIAGNOSIS — R260 Ataxic gait: Secondary | ICD-10-CM | POA: Diagnosis not present

## 2022-02-11 DIAGNOSIS — F325 Major depressive disorder, single episode, in full remission: Secondary | ICD-10-CM | POA: Diagnosis not present

## 2022-02-11 DIAGNOSIS — Z0000 Encounter for general adult medical examination without abnormal findings: Secondary | ICD-10-CM | POA: Diagnosis not present

## 2022-02-11 DIAGNOSIS — I129 Hypertensive chronic kidney disease with stage 1 through stage 4 chronic kidney disease, or unspecified chronic kidney disease: Secondary | ICD-10-CM | POA: Diagnosis not present

## 2022-02-11 DIAGNOSIS — E7800 Pure hypercholesterolemia, unspecified: Secondary | ICD-10-CM | POA: Diagnosis not present

## 2022-06-15 DIAGNOSIS — I129 Hypertensive chronic kidney disease with stage 1 through stage 4 chronic kidney disease, or unspecified chronic kidney disease: Secondary | ICD-10-CM | POA: Diagnosis not present

## 2022-06-15 DIAGNOSIS — N183 Chronic kidney disease, stage 3 unspecified: Secondary | ICD-10-CM | POA: Diagnosis not present

## 2022-06-15 DIAGNOSIS — Z8673 Personal history of transient ischemic attack (TIA), and cerebral infarction without residual deficits: Secondary | ICD-10-CM | POA: Diagnosis not present

## 2022-06-15 DIAGNOSIS — F0280 Dementia in other diseases classified elsewhere without behavioral disturbance: Secondary | ICD-10-CM | POA: Diagnosis not present

## 2022-06-15 DIAGNOSIS — F32A Depression, unspecified: Secondary | ICD-10-CM | POA: Diagnosis not present

## 2022-06-15 DIAGNOSIS — G309 Alzheimer's disease, unspecified: Secondary | ICD-10-CM | POA: Diagnosis not present

## 2022-06-15 DIAGNOSIS — F0150 Vascular dementia without behavioral disturbance: Secondary | ICD-10-CM | POA: Diagnosis not present

## 2022-06-15 DIAGNOSIS — Z87891 Personal history of nicotine dependence: Secondary | ICD-10-CM | POA: Diagnosis not present

## 2022-06-22 DIAGNOSIS — M21372 Foot drop, left foot: Secondary | ICD-10-CM | POA: Diagnosis not present

## 2022-08-12 DIAGNOSIS — H5202 Hypermetropia, left eye: Secondary | ICD-10-CM | POA: Diagnosis not present

## 2022-08-16 DIAGNOSIS — N400 Enlarged prostate without lower urinary tract symptoms: Secondary | ICD-10-CM | POA: Diagnosis not present

## 2022-08-16 DIAGNOSIS — F0150 Vascular dementia without behavioral disturbance: Secondary | ICD-10-CM | POA: Diagnosis not present

## 2022-08-16 DIAGNOSIS — Z23 Encounter for immunization: Secondary | ICD-10-CM | POA: Diagnosis not present

## 2022-08-16 DIAGNOSIS — Z8673 Personal history of transient ischemic attack (TIA), and cerebral infarction without residual deficits: Secondary | ICD-10-CM | POA: Diagnosis not present

## 2022-08-16 DIAGNOSIS — N183 Chronic kidney disease, stage 3 unspecified: Secondary | ICD-10-CM | POA: Diagnosis not present

## 2022-08-16 DIAGNOSIS — E7800 Pure hypercholesterolemia, unspecified: Secondary | ICD-10-CM | POA: Diagnosis not present

## 2022-08-16 DIAGNOSIS — E538 Deficiency of other specified B group vitamins: Secondary | ICD-10-CM | POA: Diagnosis not present

## 2022-08-16 DIAGNOSIS — E785 Hyperlipidemia, unspecified: Secondary | ICD-10-CM | POA: Diagnosis not present

## 2022-08-16 DIAGNOSIS — G309 Alzheimer's disease, unspecified: Secondary | ICD-10-CM | POA: Diagnosis not present

## 2022-08-16 DIAGNOSIS — F32A Depression, unspecified: Secondary | ICD-10-CM | POA: Diagnosis not present

## 2022-08-16 DIAGNOSIS — N1831 Chronic kidney disease, stage 3a: Secondary | ICD-10-CM | POA: Diagnosis not present

## 2022-08-16 DIAGNOSIS — I10 Essential (primary) hypertension: Secondary | ICD-10-CM | POA: Diagnosis not present

## 2022-08-25 DIAGNOSIS — Z0100 Encounter for examination of eyes and vision without abnormal findings: Secondary | ICD-10-CM | POA: Diagnosis not present

## 2022-09-22 ENCOUNTER — Ambulatory Visit: Payer: Medicare HMO | Admitting: Dermatology

## 2022-12-25 DIAGNOSIS — E876 Hypokalemia: Secondary | ICD-10-CM | POA: Diagnosis not present

## 2022-12-25 DIAGNOSIS — R6520 Severe sepsis without septic shock: Secondary | ICD-10-CM | POA: Diagnosis not present

## 2022-12-25 DIAGNOSIS — I10 Essential (primary) hypertension: Secondary | ICD-10-CM | POA: Diagnosis not present

## 2022-12-25 DIAGNOSIS — J9601 Acute respiratory failure with hypoxia: Secondary | ICD-10-CM | POA: Diagnosis not present

## 2022-12-25 DIAGNOSIS — R531 Weakness: Secondary | ICD-10-CM | POA: Diagnosis not present

## 2022-12-25 DIAGNOSIS — R41841 Cognitive communication deficit: Secondary | ICD-10-CM | POA: Diagnosis not present

## 2022-12-25 DIAGNOSIS — R918 Other nonspecific abnormal finding of lung field: Secondary | ICD-10-CM | POA: Diagnosis not present

## 2022-12-25 DIAGNOSIS — A4189 Other specified sepsis: Secondary | ICD-10-CM | POA: Diagnosis not present

## 2022-12-25 DIAGNOSIS — R279 Unspecified lack of coordination: Secondary | ICD-10-CM | POA: Diagnosis not present

## 2022-12-25 DIAGNOSIS — N183 Chronic kidney disease, stage 3 unspecified: Secondary | ICD-10-CM | POA: Diagnosis not present

## 2022-12-25 DIAGNOSIS — I129 Hypertensive chronic kidney disease with stage 1 through stage 4 chronic kidney disease, or unspecified chronic kidney disease: Secondary | ICD-10-CM | POA: Diagnosis not present

## 2022-12-25 DIAGNOSIS — R2689 Other abnormalities of gait and mobility: Secondary | ICD-10-CM | POA: Diagnosis not present

## 2022-12-25 DIAGNOSIS — M6281 Muscle weakness (generalized): Secondary | ICD-10-CM | POA: Diagnosis not present

## 2022-12-25 DIAGNOSIS — M21372 Foot drop, left foot: Secondary | ICD-10-CM | POA: Diagnosis not present

## 2022-12-25 DIAGNOSIS — R944 Abnormal results of kidney function studies: Secondary | ICD-10-CM | POA: Diagnosis not present

## 2022-12-25 DIAGNOSIS — R55 Syncope and collapse: Secondary | ICD-10-CM | POA: Diagnosis not present

## 2022-12-25 DIAGNOSIS — D696 Thrombocytopenia, unspecified: Secondary | ICD-10-CM | POA: Diagnosis not present

## 2022-12-25 DIAGNOSIS — E785 Hyperlipidemia, unspecified: Secondary | ICD-10-CM | POA: Diagnosis not present

## 2022-12-25 DIAGNOSIS — A000 Cholera due to Vibrio cholerae 01, biovar cholerae: Secondary | ICD-10-CM | POA: Diagnosis not present

## 2022-12-25 DIAGNOSIS — R0600 Dyspnea, unspecified: Secondary | ICD-10-CM | POA: Diagnosis not present

## 2022-12-25 DIAGNOSIS — R001 Bradycardia, unspecified: Secondary | ICD-10-CM | POA: Diagnosis not present

## 2022-12-25 DIAGNOSIS — W19XXXA Unspecified fall, initial encounter: Secondary | ICD-10-CM | POA: Diagnosis not present

## 2022-12-25 DIAGNOSIS — U071 COVID-19: Secondary | ICD-10-CM | POA: Diagnosis not present

## 2022-12-25 DIAGNOSIS — F05 Delirium due to known physiological condition: Secondary | ICD-10-CM | POA: Diagnosis not present

## 2022-12-25 DIAGNOSIS — F0390 Unspecified dementia without behavioral disturbance: Secondary | ICD-10-CM | POA: Diagnosis not present

## 2022-12-25 DIAGNOSIS — R5381 Other malaise: Secondary | ICD-10-CM | POA: Diagnosis not present

## 2022-12-25 DIAGNOSIS — R410 Disorientation, unspecified: Secondary | ICD-10-CM | POA: Diagnosis not present

## 2022-12-25 DIAGNOSIS — M549 Dorsalgia, unspecified: Secondary | ICD-10-CM | POA: Diagnosis not present

## 2022-12-25 DIAGNOSIS — J189 Pneumonia, unspecified organism: Secondary | ICD-10-CM | POA: Diagnosis not present

## 2022-12-25 DIAGNOSIS — R0902 Hypoxemia: Secondary | ICD-10-CM | POA: Diagnosis not present

## 2022-12-25 DIAGNOSIS — R7989 Other specified abnormal findings of blood chemistry: Secondary | ICD-10-CM | POA: Diagnosis not present

## 2022-12-25 DIAGNOSIS — J1282 Pneumonia due to coronavirus disease 2019: Secondary | ICD-10-CM | POA: Diagnosis not present

## 2022-12-25 DIAGNOSIS — F419 Anxiety disorder, unspecified: Secondary | ICD-10-CM | POA: Diagnosis not present

## 2022-12-26 DIAGNOSIS — R410 Disorientation, unspecified: Secondary | ICD-10-CM | POA: Diagnosis not present

## 2022-12-26 DIAGNOSIS — R944 Abnormal results of kidney function studies: Secondary | ICD-10-CM | POA: Diagnosis not present

## 2022-12-26 DIAGNOSIS — R0902 Hypoxemia: Secondary | ICD-10-CM | POA: Diagnosis not present

## 2022-12-26 DIAGNOSIS — J1282 Pneumonia due to coronavirus disease 2019: Secondary | ICD-10-CM | POA: Diagnosis not present

## 2022-12-26 DIAGNOSIS — U071 COVID-19: Secondary | ICD-10-CM | POA: Diagnosis not present

## 2022-12-26 DIAGNOSIS — R531 Weakness: Secondary | ICD-10-CM | POA: Diagnosis not present

## 2022-12-27 DIAGNOSIS — U071 COVID-19: Secondary | ICD-10-CM | POA: Diagnosis not present

## 2022-12-27 DIAGNOSIS — J1282 Pneumonia due to coronavirus disease 2019: Secondary | ICD-10-CM | POA: Diagnosis not present

## 2022-12-27 DIAGNOSIS — R7989 Other specified abnormal findings of blood chemistry: Secondary | ICD-10-CM | POA: Diagnosis not present

## 2022-12-27 DIAGNOSIS — I10 Essential (primary) hypertension: Secondary | ICD-10-CM | POA: Diagnosis not present

## 2022-12-27 DIAGNOSIS — J9601 Acute respiratory failure with hypoxia: Secondary | ICD-10-CM | POA: Diagnosis not present

## 2022-12-27 DIAGNOSIS — E876 Hypokalemia: Secondary | ICD-10-CM | POA: Diagnosis not present

## 2022-12-28 DIAGNOSIS — J1282 Pneumonia due to coronavirus disease 2019: Secondary | ICD-10-CM | POA: Diagnosis not present

## 2022-12-28 DIAGNOSIS — I129 Hypertensive chronic kidney disease with stage 1 through stage 4 chronic kidney disease, or unspecified chronic kidney disease: Secondary | ICD-10-CM | POA: Diagnosis not present

## 2022-12-28 DIAGNOSIS — J9601 Acute respiratory failure with hypoxia: Secondary | ICD-10-CM | POA: Diagnosis not present

## 2022-12-28 DIAGNOSIS — U071 COVID-19: Secondary | ICD-10-CM | POA: Diagnosis not present

## 2022-12-28 DIAGNOSIS — N183 Chronic kidney disease, stage 3 unspecified: Secondary | ICD-10-CM | POA: Diagnosis not present

## 2022-12-29 DIAGNOSIS — F419 Anxiety disorder, unspecified: Secondary | ICD-10-CM | POA: Diagnosis not present

## 2022-12-29 DIAGNOSIS — I129 Hypertensive chronic kidney disease with stage 1 through stage 4 chronic kidney disease, or unspecified chronic kidney disease: Secondary | ICD-10-CM | POA: Diagnosis not present

## 2022-12-29 DIAGNOSIS — M21372 Foot drop, left foot: Secondary | ICD-10-CM | POA: Diagnosis not present

## 2022-12-29 DIAGNOSIS — E785 Hyperlipidemia, unspecified: Secondary | ICD-10-CM | POA: Diagnosis not present

## 2022-12-29 DIAGNOSIS — F0390 Unspecified dementia without behavioral disturbance: Secondary | ICD-10-CM | POA: Diagnosis not present

## 2022-12-29 DIAGNOSIS — N183 Chronic kidney disease, stage 3 unspecified: Secondary | ICD-10-CM | POA: Diagnosis not present

## 2022-12-29 DIAGNOSIS — J1282 Pneumonia due to coronavirus disease 2019: Secondary | ICD-10-CM | POA: Diagnosis not present

## 2022-12-29 DIAGNOSIS — J9601 Acute respiratory failure with hypoxia: Secondary | ICD-10-CM | POA: Diagnosis not present

## 2022-12-29 DIAGNOSIS — U071 COVID-19: Secondary | ICD-10-CM | POA: Diagnosis not present

## 2022-12-30 DIAGNOSIS — J9601 Acute respiratory failure with hypoxia: Secondary | ICD-10-CM | POA: Diagnosis not present

## 2022-12-30 DIAGNOSIS — F0390 Unspecified dementia without behavioral disturbance: Secondary | ICD-10-CM | POA: Diagnosis not present

## 2022-12-30 DIAGNOSIS — E785 Hyperlipidemia, unspecified: Secondary | ICD-10-CM | POA: Diagnosis not present

## 2022-12-30 DIAGNOSIS — N183 Chronic kidney disease, stage 3 unspecified: Secondary | ICD-10-CM | POA: Diagnosis not present

## 2022-12-30 DIAGNOSIS — U071 COVID-19: Secondary | ICD-10-CM | POA: Diagnosis not present

## 2022-12-30 DIAGNOSIS — I129 Hypertensive chronic kidney disease with stage 1 through stage 4 chronic kidney disease, or unspecified chronic kidney disease: Secondary | ICD-10-CM | POA: Diagnosis not present

## 2022-12-30 DIAGNOSIS — J1282 Pneumonia due to coronavirus disease 2019: Secondary | ICD-10-CM | POA: Diagnosis not present

## 2022-12-30 DIAGNOSIS — M21372 Foot drop, left foot: Secondary | ICD-10-CM | POA: Diagnosis not present

## 2022-12-30 DIAGNOSIS — F419 Anxiety disorder, unspecified: Secondary | ICD-10-CM | POA: Diagnosis not present

## 2022-12-31 DIAGNOSIS — N183 Chronic kidney disease, stage 3 unspecified: Secondary | ICD-10-CM | POA: Diagnosis not present

## 2022-12-31 DIAGNOSIS — E785 Hyperlipidemia, unspecified: Secondary | ICD-10-CM | POA: Diagnosis not present

## 2022-12-31 DIAGNOSIS — I129 Hypertensive chronic kidney disease with stage 1 through stage 4 chronic kidney disease, or unspecified chronic kidney disease: Secondary | ICD-10-CM | POA: Diagnosis not present

## 2022-12-31 DIAGNOSIS — J1282 Pneumonia due to coronavirus disease 2019: Secondary | ICD-10-CM | POA: Diagnosis not present

## 2022-12-31 DIAGNOSIS — M21372 Foot drop, left foot: Secondary | ICD-10-CM | POA: Diagnosis not present

## 2022-12-31 DIAGNOSIS — F0390 Unspecified dementia without behavioral disturbance: Secondary | ICD-10-CM | POA: Diagnosis not present

## 2022-12-31 DIAGNOSIS — J9601 Acute respiratory failure with hypoxia: Secondary | ICD-10-CM | POA: Diagnosis not present

## 2022-12-31 DIAGNOSIS — U071 COVID-19: Secondary | ICD-10-CM | POA: Diagnosis not present

## 2022-12-31 DIAGNOSIS — F419 Anxiety disorder, unspecified: Secondary | ICD-10-CM | POA: Diagnosis not present

## 2023-01-01 DIAGNOSIS — U071 COVID-19: Secondary | ICD-10-CM | POA: Diagnosis not present

## 2023-01-01 DIAGNOSIS — J9601 Acute respiratory failure with hypoxia: Secondary | ICD-10-CM | POA: Diagnosis not present

## 2023-01-01 DIAGNOSIS — J1282 Pneumonia due to coronavirus disease 2019: Secondary | ICD-10-CM | POA: Diagnosis not present

## 2023-01-01 DIAGNOSIS — F0390 Unspecified dementia without behavioral disturbance: Secondary | ICD-10-CM | POA: Diagnosis not present

## 2023-01-01 DIAGNOSIS — N183 Chronic kidney disease, stage 3 unspecified: Secondary | ICD-10-CM | POA: Diagnosis not present

## 2023-01-01 DIAGNOSIS — I129 Hypertensive chronic kidney disease with stage 1 through stage 4 chronic kidney disease, or unspecified chronic kidney disease: Secondary | ICD-10-CM | POA: Diagnosis not present

## 2023-01-02 DIAGNOSIS — F0390 Unspecified dementia without behavioral disturbance: Secondary | ICD-10-CM | POA: Diagnosis not present

## 2023-01-02 DIAGNOSIS — I129 Hypertensive chronic kidney disease with stage 1 through stage 4 chronic kidney disease, or unspecified chronic kidney disease: Secondary | ICD-10-CM | POA: Diagnosis not present

## 2023-01-02 DIAGNOSIS — J9601 Acute respiratory failure with hypoxia: Secondary | ICD-10-CM | POA: Diagnosis not present

## 2023-01-02 DIAGNOSIS — J1282 Pneumonia due to coronavirus disease 2019: Secondary | ICD-10-CM | POA: Diagnosis not present

## 2023-01-02 DIAGNOSIS — U071 COVID-19: Secondary | ICD-10-CM | POA: Diagnosis not present

## 2023-01-02 DIAGNOSIS — N183 Chronic kidney disease, stage 3 unspecified: Secondary | ICD-10-CM | POA: Diagnosis not present

## 2023-01-03 DIAGNOSIS — J1282 Pneumonia due to coronavirus disease 2019: Secondary | ICD-10-CM | POA: Diagnosis not present

## 2023-01-03 DIAGNOSIS — I10 Essential (primary) hypertension: Secondary | ICD-10-CM | POA: Diagnosis not present

## 2023-01-03 DIAGNOSIS — R001 Bradycardia, unspecified: Secondary | ICD-10-CM | POA: Diagnosis not present

## 2023-01-03 DIAGNOSIS — U071 COVID-19: Secondary | ICD-10-CM | POA: Diagnosis not present

## 2023-01-03 DIAGNOSIS — F0390 Unspecified dementia without behavioral disturbance: Secondary | ICD-10-CM | POA: Diagnosis not present

## 2023-01-03 DIAGNOSIS — R0902 Hypoxemia: Secondary | ICD-10-CM | POA: Diagnosis not present

## 2023-01-04 DIAGNOSIS — R0902 Hypoxemia: Secondary | ICD-10-CM | POA: Diagnosis not present

## 2023-01-04 DIAGNOSIS — U071 COVID-19: Secondary | ICD-10-CM | POA: Diagnosis not present

## 2023-01-04 DIAGNOSIS — R001 Bradycardia, unspecified: Secondary | ICD-10-CM | POA: Diagnosis not present

## 2023-01-04 DIAGNOSIS — F0390 Unspecified dementia without behavioral disturbance: Secondary | ICD-10-CM | POA: Diagnosis not present

## 2023-01-04 DIAGNOSIS — J1282 Pneumonia due to coronavirus disease 2019: Secondary | ICD-10-CM | POA: Diagnosis not present

## 2023-01-04 DIAGNOSIS — I10 Essential (primary) hypertension: Secondary | ICD-10-CM | POA: Diagnosis not present

## 2023-01-05 DIAGNOSIS — J9601 Acute respiratory failure with hypoxia: Secondary | ICD-10-CM | POA: Diagnosis not present

## 2023-01-05 DIAGNOSIS — R2689 Other abnormalities of gait and mobility: Secondary | ICD-10-CM | POA: Diagnosis not present

## 2023-01-05 DIAGNOSIS — R5381 Other malaise: Secondary | ICD-10-CM | POA: Diagnosis not present

## 2023-01-05 DIAGNOSIS — N183 Chronic kidney disease, stage 3 unspecified: Secondary | ICD-10-CM | POA: Diagnosis not present

## 2023-01-05 DIAGNOSIS — U071 COVID-19: Secondary | ICD-10-CM | POA: Diagnosis not present

## 2023-01-05 DIAGNOSIS — J189 Pneumonia, unspecified organism: Secondary | ICD-10-CM | POA: Diagnosis not present

## 2023-01-05 DIAGNOSIS — J1282 Pneumonia due to coronavirus disease 2019: Secondary | ICD-10-CM | POA: Diagnosis not present

## 2023-01-05 DIAGNOSIS — R41841 Cognitive communication deficit: Secondary | ICD-10-CM | POA: Diagnosis not present

## 2023-01-05 DIAGNOSIS — I129 Hypertensive chronic kidney disease with stage 1 through stage 4 chronic kidney disease, or unspecified chronic kidney disease: Secondary | ICD-10-CM | POA: Diagnosis not present

## 2023-01-05 DIAGNOSIS — R001 Bradycardia, unspecified: Secondary | ICD-10-CM | POA: Diagnosis not present

## 2023-01-05 DIAGNOSIS — A000 Cholera due to Vibrio cholerae 01, biovar cholerae: Secondary | ICD-10-CM | POA: Diagnosis not present

## 2023-01-05 DIAGNOSIS — R279 Unspecified lack of coordination: Secondary | ICD-10-CM | POA: Diagnosis not present

## 2023-01-05 DIAGNOSIS — M6281 Muscle weakness (generalized): Secondary | ICD-10-CM | POA: Diagnosis not present

## 2023-01-06 DIAGNOSIS — R5381 Other malaise: Secondary | ICD-10-CM | POA: Diagnosis not present

## 2023-01-11 DIAGNOSIS — R5381 Other malaise: Secondary | ICD-10-CM | POA: Diagnosis not present

## 2023-01-18 DIAGNOSIS — R5381 Other malaise: Secondary | ICD-10-CM | POA: Diagnosis not present

## 2023-01-22 DIAGNOSIS — U071 COVID-19: Secondary | ICD-10-CM | POA: Diagnosis not present

## 2023-01-22 DIAGNOSIS — J1282 Pneumonia due to coronavirus disease 2019: Secondary | ICD-10-CM | POA: Diagnosis not present

## 2023-01-22 DIAGNOSIS — N183 Chronic kidney disease, stage 3 unspecified: Secondary | ICD-10-CM | POA: Diagnosis not present

## 2023-01-22 DIAGNOSIS — J9601 Acute respiratory failure with hypoxia: Secondary | ICD-10-CM | POA: Diagnosis not present

## 2023-01-22 DIAGNOSIS — F411 Generalized anxiety disorder: Secondary | ICD-10-CM | POA: Diagnosis not present

## 2023-01-22 DIAGNOSIS — E1122 Type 2 diabetes mellitus with diabetic chronic kidney disease: Secondary | ICD-10-CM | POA: Diagnosis not present

## 2023-01-22 DIAGNOSIS — I129 Hypertensive chronic kidney disease with stage 1 through stage 4 chronic kidney disease, or unspecified chronic kidney disease: Secondary | ICD-10-CM | POA: Diagnosis not present

## 2023-01-22 DIAGNOSIS — F0154 Vascular dementia, unspecified severity, with anxiety: Secondary | ICD-10-CM | POA: Diagnosis not present

## 2023-01-22 DIAGNOSIS — M21372 Foot drop, left foot: Secondary | ICD-10-CM | POA: Diagnosis not present

## 2023-01-25 DIAGNOSIS — U071 COVID-19: Secondary | ICD-10-CM | POA: Diagnosis not present

## 2023-01-25 DIAGNOSIS — F411 Generalized anxiety disorder: Secondary | ICD-10-CM | POA: Diagnosis not present

## 2023-01-25 DIAGNOSIS — N183 Chronic kidney disease, stage 3 unspecified: Secondary | ICD-10-CM | POA: Diagnosis not present

## 2023-01-25 DIAGNOSIS — J9601 Acute respiratory failure with hypoxia: Secondary | ICD-10-CM | POA: Diagnosis not present

## 2023-01-25 DIAGNOSIS — J1282 Pneumonia due to coronavirus disease 2019: Secondary | ICD-10-CM | POA: Diagnosis not present

## 2023-01-25 DIAGNOSIS — F0154 Vascular dementia, unspecified severity, with anxiety: Secondary | ICD-10-CM | POA: Diagnosis not present

## 2023-01-25 DIAGNOSIS — M21372 Foot drop, left foot: Secondary | ICD-10-CM | POA: Diagnosis not present

## 2023-01-25 DIAGNOSIS — E1122 Type 2 diabetes mellitus with diabetic chronic kidney disease: Secondary | ICD-10-CM | POA: Diagnosis not present

## 2023-01-25 DIAGNOSIS — I129 Hypertensive chronic kidney disease with stage 1 through stage 4 chronic kidney disease, or unspecified chronic kidney disease: Secondary | ICD-10-CM | POA: Diagnosis not present

## 2023-01-27 DIAGNOSIS — J9601 Acute respiratory failure with hypoxia: Secondary | ICD-10-CM | POA: Diagnosis not present

## 2023-01-27 DIAGNOSIS — J1282 Pneumonia due to coronavirus disease 2019: Secondary | ICD-10-CM | POA: Diagnosis not present

## 2023-01-27 DIAGNOSIS — M21372 Foot drop, left foot: Secondary | ICD-10-CM | POA: Diagnosis not present

## 2023-01-27 DIAGNOSIS — F0154 Vascular dementia, unspecified severity, with anxiety: Secondary | ICD-10-CM | POA: Diagnosis not present

## 2023-01-27 DIAGNOSIS — N183 Chronic kidney disease, stage 3 unspecified: Secondary | ICD-10-CM | POA: Diagnosis not present

## 2023-01-27 DIAGNOSIS — U071 COVID-19: Secondary | ICD-10-CM | POA: Diagnosis not present

## 2023-01-27 DIAGNOSIS — I129 Hypertensive chronic kidney disease with stage 1 through stage 4 chronic kidney disease, or unspecified chronic kidney disease: Secondary | ICD-10-CM | POA: Diagnosis not present

## 2023-01-27 DIAGNOSIS — E1122 Type 2 diabetes mellitus with diabetic chronic kidney disease: Secondary | ICD-10-CM | POA: Diagnosis not present

## 2023-01-27 DIAGNOSIS — F411 Generalized anxiety disorder: Secondary | ICD-10-CM | POA: Diagnosis not present

## 2023-01-29 DIAGNOSIS — I129 Hypertensive chronic kidney disease with stage 1 through stage 4 chronic kidney disease, or unspecified chronic kidney disease: Secondary | ICD-10-CM | POA: Diagnosis not present

## 2023-01-29 DIAGNOSIS — F411 Generalized anxiety disorder: Secondary | ICD-10-CM | POA: Diagnosis not present

## 2023-01-29 DIAGNOSIS — N183 Chronic kidney disease, stage 3 unspecified: Secondary | ICD-10-CM | POA: Diagnosis not present

## 2023-01-29 DIAGNOSIS — E1122 Type 2 diabetes mellitus with diabetic chronic kidney disease: Secondary | ICD-10-CM | POA: Diagnosis not present

## 2023-01-29 DIAGNOSIS — J9601 Acute respiratory failure with hypoxia: Secondary | ICD-10-CM | POA: Diagnosis not present

## 2023-01-29 DIAGNOSIS — U071 COVID-19: Secondary | ICD-10-CM | POA: Diagnosis not present

## 2023-01-29 DIAGNOSIS — F0154 Vascular dementia, unspecified severity, with anxiety: Secondary | ICD-10-CM | POA: Diagnosis not present

## 2023-01-29 DIAGNOSIS — J1282 Pneumonia due to coronavirus disease 2019: Secondary | ICD-10-CM | POA: Diagnosis not present

## 2023-01-29 DIAGNOSIS — M21372 Foot drop, left foot: Secondary | ICD-10-CM | POA: Diagnosis not present

## 2023-02-01 DIAGNOSIS — J9601 Acute respiratory failure with hypoxia: Secondary | ICD-10-CM | POA: Diagnosis not present

## 2023-02-01 DIAGNOSIS — U071 COVID-19: Secondary | ICD-10-CM | POA: Diagnosis not present

## 2023-02-01 DIAGNOSIS — J1282 Pneumonia due to coronavirus disease 2019: Secondary | ICD-10-CM | POA: Diagnosis not present

## 2023-02-01 DIAGNOSIS — M21372 Foot drop, left foot: Secondary | ICD-10-CM | POA: Diagnosis not present

## 2023-02-01 DIAGNOSIS — I129 Hypertensive chronic kidney disease with stage 1 through stage 4 chronic kidney disease, or unspecified chronic kidney disease: Secondary | ICD-10-CM | POA: Diagnosis not present

## 2023-02-01 DIAGNOSIS — N183 Chronic kidney disease, stage 3 unspecified: Secondary | ICD-10-CM | POA: Diagnosis not present

## 2023-02-01 DIAGNOSIS — E1122 Type 2 diabetes mellitus with diabetic chronic kidney disease: Secondary | ICD-10-CM | POA: Diagnosis not present

## 2023-02-02 DIAGNOSIS — N183 Chronic kidney disease, stage 3 unspecified: Secondary | ICD-10-CM | POA: Diagnosis not present

## 2023-02-02 DIAGNOSIS — M21372 Foot drop, left foot: Secondary | ICD-10-CM | POA: Diagnosis not present

## 2023-02-02 DIAGNOSIS — I129 Hypertensive chronic kidney disease with stage 1 through stage 4 chronic kidney disease, or unspecified chronic kidney disease: Secondary | ICD-10-CM | POA: Diagnosis not present

## 2023-02-02 DIAGNOSIS — J9601 Acute respiratory failure with hypoxia: Secondary | ICD-10-CM | POA: Diagnosis not present

## 2023-02-02 DIAGNOSIS — U071 COVID-19: Secondary | ICD-10-CM | POA: Diagnosis not present

## 2023-02-02 DIAGNOSIS — E1122 Type 2 diabetes mellitus with diabetic chronic kidney disease: Secondary | ICD-10-CM | POA: Diagnosis not present

## 2023-02-02 DIAGNOSIS — J1282 Pneumonia due to coronavirus disease 2019: Secondary | ICD-10-CM | POA: Diagnosis not present

## 2023-02-02 DIAGNOSIS — F0154 Vascular dementia, unspecified severity, with anxiety: Secondary | ICD-10-CM | POA: Diagnosis not present

## 2023-02-02 DIAGNOSIS — F411 Generalized anxiety disorder: Secondary | ICD-10-CM | POA: Diagnosis not present

## 2023-02-05 DIAGNOSIS — J9601 Acute respiratory failure with hypoxia: Secondary | ICD-10-CM | POA: Diagnosis not present

## 2023-02-05 DIAGNOSIS — F411 Generalized anxiety disorder: Secondary | ICD-10-CM | POA: Diagnosis not present

## 2023-02-05 DIAGNOSIS — N183 Chronic kidney disease, stage 3 unspecified: Secondary | ICD-10-CM | POA: Diagnosis not present

## 2023-02-05 DIAGNOSIS — M21372 Foot drop, left foot: Secondary | ICD-10-CM | POA: Diagnosis not present

## 2023-02-05 DIAGNOSIS — J1282 Pneumonia due to coronavirus disease 2019: Secondary | ICD-10-CM | POA: Diagnosis not present

## 2023-02-05 DIAGNOSIS — F0154 Vascular dementia, unspecified severity, with anxiety: Secondary | ICD-10-CM | POA: Diagnosis not present

## 2023-02-05 DIAGNOSIS — E1122 Type 2 diabetes mellitus with diabetic chronic kidney disease: Secondary | ICD-10-CM | POA: Diagnosis not present

## 2023-02-05 DIAGNOSIS — I129 Hypertensive chronic kidney disease with stage 1 through stage 4 chronic kidney disease, or unspecified chronic kidney disease: Secondary | ICD-10-CM | POA: Diagnosis not present

## 2023-02-05 DIAGNOSIS — U071 COVID-19: Secondary | ICD-10-CM | POA: Diagnosis not present

## 2023-02-08 DIAGNOSIS — M21372 Foot drop, left foot: Secondary | ICD-10-CM | POA: Diagnosis not present

## 2023-02-08 DIAGNOSIS — I129 Hypertensive chronic kidney disease with stage 1 through stage 4 chronic kidney disease, or unspecified chronic kidney disease: Secondary | ICD-10-CM | POA: Diagnosis not present

## 2023-02-08 DIAGNOSIS — U071 COVID-19: Secondary | ICD-10-CM | POA: Diagnosis not present

## 2023-02-08 DIAGNOSIS — J1282 Pneumonia due to coronavirus disease 2019: Secondary | ICD-10-CM | POA: Diagnosis not present

## 2023-02-08 DIAGNOSIS — F0154 Vascular dementia, unspecified severity, with anxiety: Secondary | ICD-10-CM | POA: Diagnosis not present

## 2023-02-08 DIAGNOSIS — J9601 Acute respiratory failure with hypoxia: Secondary | ICD-10-CM | POA: Diagnosis not present

## 2023-02-08 DIAGNOSIS — F411 Generalized anxiety disorder: Secondary | ICD-10-CM | POA: Diagnosis not present

## 2023-02-08 DIAGNOSIS — N183 Chronic kidney disease, stage 3 unspecified: Secondary | ICD-10-CM | POA: Diagnosis not present

## 2023-02-08 DIAGNOSIS — E1122 Type 2 diabetes mellitus with diabetic chronic kidney disease: Secondary | ICD-10-CM | POA: Diagnosis not present

## 2023-02-11 DIAGNOSIS — F0154 Vascular dementia, unspecified severity, with anxiety: Secondary | ICD-10-CM | POA: Diagnosis not present

## 2023-02-11 DIAGNOSIS — I129 Hypertensive chronic kidney disease with stage 1 through stage 4 chronic kidney disease, or unspecified chronic kidney disease: Secondary | ICD-10-CM | POA: Diagnosis not present

## 2023-02-11 DIAGNOSIS — J1282 Pneumonia due to coronavirus disease 2019: Secondary | ICD-10-CM | POA: Diagnosis not present

## 2023-02-11 DIAGNOSIS — N183 Chronic kidney disease, stage 3 unspecified: Secondary | ICD-10-CM | POA: Diagnosis not present

## 2023-02-11 DIAGNOSIS — E1122 Type 2 diabetes mellitus with diabetic chronic kidney disease: Secondary | ICD-10-CM | POA: Diagnosis not present

## 2023-02-11 DIAGNOSIS — U071 COVID-19: Secondary | ICD-10-CM | POA: Diagnosis not present

## 2023-02-11 DIAGNOSIS — F411 Generalized anxiety disorder: Secondary | ICD-10-CM | POA: Diagnosis not present

## 2023-02-11 DIAGNOSIS — M21372 Foot drop, left foot: Secondary | ICD-10-CM | POA: Diagnosis not present

## 2023-02-11 DIAGNOSIS — J9601 Acute respiratory failure with hypoxia: Secondary | ICD-10-CM | POA: Diagnosis not present

## 2023-02-16 DIAGNOSIS — I129 Hypertensive chronic kidney disease with stage 1 through stage 4 chronic kidney disease, or unspecified chronic kidney disease: Secondary | ICD-10-CM | POA: Diagnosis not present

## 2023-02-16 DIAGNOSIS — G309 Alzheimer's disease, unspecified: Secondary | ICD-10-CM | POA: Diagnosis not present

## 2023-02-16 DIAGNOSIS — F32A Depression, unspecified: Secondary | ICD-10-CM | POA: Diagnosis not present

## 2023-02-16 DIAGNOSIS — N183 Chronic kidney disease, stage 3 unspecified: Secondary | ICD-10-CM | POA: Diagnosis not present

## 2023-02-16 DIAGNOSIS — E538 Deficiency of other specified B group vitamins: Secondary | ICD-10-CM | POA: Diagnosis not present

## 2023-02-16 DIAGNOSIS — F0150 Vascular dementia without behavioral disturbance: Secondary | ICD-10-CM | POA: Diagnosis not present

## 2023-02-16 DIAGNOSIS — Z1331 Encounter for screening for depression: Secondary | ICD-10-CM | POA: Diagnosis not present

## 2023-02-16 DIAGNOSIS — E7800 Pure hypercholesterolemia, unspecified: Secondary | ICD-10-CM | POA: Diagnosis not present

## 2023-02-16 DIAGNOSIS — I10 Essential (primary) hypertension: Secondary | ICD-10-CM | POA: Diagnosis not present

## 2023-02-16 DIAGNOSIS — E785 Hyperlipidemia, unspecified: Secondary | ICD-10-CM | POA: Diagnosis not present

## 2023-02-16 DIAGNOSIS — N1831 Chronic kidney disease, stage 3a: Secondary | ICD-10-CM | POA: Diagnosis not present

## 2023-02-16 DIAGNOSIS — Z0000 Encounter for general adult medical examination without abnormal findings: Secondary | ICD-10-CM | POA: Diagnosis not present

## 2023-02-17 DIAGNOSIS — I129 Hypertensive chronic kidney disease with stage 1 through stage 4 chronic kidney disease, or unspecified chronic kidney disease: Secondary | ICD-10-CM | POA: Diagnosis not present

## 2023-02-17 DIAGNOSIS — J9601 Acute respiratory failure with hypoxia: Secondary | ICD-10-CM | POA: Diagnosis not present

## 2023-02-17 DIAGNOSIS — U071 COVID-19: Secondary | ICD-10-CM | POA: Diagnosis not present

## 2023-02-17 DIAGNOSIS — J1282 Pneumonia due to coronavirus disease 2019: Secondary | ICD-10-CM | POA: Diagnosis not present

## 2023-02-17 DIAGNOSIS — F0154 Vascular dementia, unspecified severity, with anxiety: Secondary | ICD-10-CM | POA: Diagnosis not present

## 2023-02-17 DIAGNOSIS — E1122 Type 2 diabetes mellitus with diabetic chronic kidney disease: Secondary | ICD-10-CM | POA: Diagnosis not present

## 2023-02-17 DIAGNOSIS — M21372 Foot drop, left foot: Secondary | ICD-10-CM | POA: Diagnosis not present

## 2023-02-17 DIAGNOSIS — F411 Generalized anxiety disorder: Secondary | ICD-10-CM | POA: Diagnosis not present

## 2023-02-17 DIAGNOSIS — N183 Chronic kidney disease, stage 3 unspecified: Secondary | ICD-10-CM | POA: Diagnosis not present

## 2023-02-22 DIAGNOSIS — U071 COVID-19: Secondary | ICD-10-CM | POA: Diagnosis not present

## 2023-02-22 DIAGNOSIS — M21372 Foot drop, left foot: Secondary | ICD-10-CM | POA: Diagnosis not present

## 2023-02-22 DIAGNOSIS — J9601 Acute respiratory failure with hypoxia: Secondary | ICD-10-CM | POA: Diagnosis not present

## 2023-02-22 DIAGNOSIS — E1122 Type 2 diabetes mellitus with diabetic chronic kidney disease: Secondary | ICD-10-CM | POA: Diagnosis not present

## 2023-02-22 DIAGNOSIS — F0154 Vascular dementia, unspecified severity, with anxiety: Secondary | ICD-10-CM | POA: Diagnosis not present

## 2023-02-22 DIAGNOSIS — J1282 Pneumonia due to coronavirus disease 2019: Secondary | ICD-10-CM | POA: Diagnosis not present

## 2023-02-22 DIAGNOSIS — N183 Chronic kidney disease, stage 3 unspecified: Secondary | ICD-10-CM | POA: Diagnosis not present

## 2023-02-22 DIAGNOSIS — I129 Hypertensive chronic kidney disease with stage 1 through stage 4 chronic kidney disease, or unspecified chronic kidney disease: Secondary | ICD-10-CM | POA: Diagnosis not present

## 2023-02-22 DIAGNOSIS — F411 Generalized anxiety disorder: Secondary | ICD-10-CM | POA: Diagnosis not present

## 2023-03-01 DIAGNOSIS — M21372 Foot drop, left foot: Secondary | ICD-10-CM | POA: Diagnosis not present

## 2023-03-01 DIAGNOSIS — J9601 Acute respiratory failure with hypoxia: Secondary | ICD-10-CM | POA: Diagnosis not present

## 2023-03-01 DIAGNOSIS — I129 Hypertensive chronic kidney disease with stage 1 through stage 4 chronic kidney disease, or unspecified chronic kidney disease: Secondary | ICD-10-CM | POA: Diagnosis not present

## 2023-03-01 DIAGNOSIS — F411 Generalized anxiety disorder: Secondary | ICD-10-CM | POA: Diagnosis not present

## 2023-03-01 DIAGNOSIS — F0154 Vascular dementia, unspecified severity, with anxiety: Secondary | ICD-10-CM | POA: Diagnosis not present

## 2023-03-01 DIAGNOSIS — N183 Chronic kidney disease, stage 3 unspecified: Secondary | ICD-10-CM | POA: Diagnosis not present

## 2023-03-01 DIAGNOSIS — J1282 Pneumonia due to coronavirus disease 2019: Secondary | ICD-10-CM | POA: Diagnosis not present

## 2023-03-01 DIAGNOSIS — E1122 Type 2 diabetes mellitus with diabetic chronic kidney disease: Secondary | ICD-10-CM | POA: Diagnosis not present

## 2023-03-01 DIAGNOSIS — U071 COVID-19: Secondary | ICD-10-CM | POA: Diagnosis not present

## 2023-04-07 DIAGNOSIS — I129 Hypertensive chronic kidney disease with stage 1 through stage 4 chronic kidney disease, or unspecified chronic kidney disease: Secondary | ICD-10-CM | POA: Diagnosis not present

## 2023-04-07 DIAGNOSIS — R600 Localized edema: Secondary | ICD-10-CM | POA: Diagnosis not present

## 2023-04-07 DIAGNOSIS — I69344 Monoplegia of lower limb following cerebral infarction affecting left non-dominant side: Secondary | ICD-10-CM | POA: Diagnosis not present

## 2023-04-07 DIAGNOSIS — E785 Hyperlipidemia, unspecified: Secondary | ICD-10-CM | POA: Diagnosis not present

## 2023-04-07 DIAGNOSIS — N183 Chronic kidney disease, stage 3 unspecified: Secondary | ICD-10-CM | POA: Diagnosis not present

## 2023-04-07 DIAGNOSIS — G309 Alzheimer's disease, unspecified: Secondary | ICD-10-CM | POA: Diagnosis not present

## 2023-04-07 DIAGNOSIS — F0150 Vascular dementia without behavioral disturbance: Secondary | ICD-10-CM | POA: Diagnosis not present

## 2023-04-07 DIAGNOSIS — F0283 Dementia in other diseases classified elsewhere, unspecified severity, with mood disturbance: Secondary | ICD-10-CM | POA: Diagnosis not present

## 2023-04-07 DIAGNOSIS — E538 Deficiency of other specified B group vitamins: Secondary | ICD-10-CM | POA: Diagnosis not present

## 2023-05-02 DIAGNOSIS — E876 Hypokalemia: Secondary | ICD-10-CM | POA: Diagnosis not present

## 2023-08-15 DIAGNOSIS — H2513 Age-related nuclear cataract, bilateral: Secondary | ICD-10-CM | POA: Diagnosis not present

## 2023-08-15 DIAGNOSIS — Z0100 Encounter for examination of eyes and vision without abnormal findings: Secondary | ICD-10-CM | POA: Diagnosis not present

## 2023-08-23 DIAGNOSIS — I10 Essential (primary) hypertension: Secondary | ICD-10-CM | POA: Diagnosis not present

## 2023-08-23 DIAGNOSIS — E7800 Pure hypercholesterolemia, unspecified: Secondary | ICD-10-CM | POA: Diagnosis not present

## 2023-08-23 DIAGNOSIS — G309 Alzheimer's disease, unspecified: Secondary | ICD-10-CM | POA: Diagnosis not present

## 2023-08-23 DIAGNOSIS — Z23 Encounter for immunization: Secondary | ICD-10-CM | POA: Diagnosis not present

## 2023-08-23 DIAGNOSIS — F0150 Vascular dementia without behavioral disturbance: Secondary | ICD-10-CM | POA: Diagnosis not present

## 2023-08-23 DIAGNOSIS — F32A Depression, unspecified: Secondary | ICD-10-CM | POA: Diagnosis not present

## 2023-08-23 DIAGNOSIS — M1990 Unspecified osteoarthritis, unspecified site: Secondary | ICD-10-CM | POA: Diagnosis not present

## 2023-08-23 DIAGNOSIS — E538 Deficiency of other specified B group vitamins: Secondary | ICD-10-CM | POA: Diagnosis not present

## 2023-08-23 DIAGNOSIS — N183 Chronic kidney disease, stage 3 unspecified: Secondary | ICD-10-CM | POA: Diagnosis not present

## 2023-08-23 DIAGNOSIS — E785 Hyperlipidemia, unspecified: Secondary | ICD-10-CM | POA: Diagnosis not present

## 2023-10-17 DIAGNOSIS — F0280 Dementia in other diseases classified elsewhere without behavioral disturbance: Secondary | ICD-10-CM | POA: Diagnosis not present

## 2023-10-17 DIAGNOSIS — G309 Alzheimer's disease, unspecified: Secondary | ICD-10-CM | POA: Diagnosis not present

## 2023-10-17 DIAGNOSIS — Z8673 Personal history of transient ischemic attack (TIA), and cerebral infarction without residual deficits: Secondary | ICD-10-CM | POA: Diagnosis not present

## 2023-10-17 DIAGNOSIS — E538 Deficiency of other specified B group vitamins: Secondary | ICD-10-CM | POA: Diagnosis not present

## 2023-10-17 DIAGNOSIS — H900 Conductive hearing loss, bilateral: Secondary | ICD-10-CM | POA: Diagnosis not present

## 2023-10-17 DIAGNOSIS — F0150 Vascular dementia without behavioral disturbance: Secondary | ICD-10-CM | POA: Diagnosis not present

## 2023-10-19 DIAGNOSIS — F02A3 Dementia in other diseases classified elsewhere, mild, with mood disturbance: Secondary | ICD-10-CM | POA: Diagnosis not present

## 2023-10-19 DIAGNOSIS — I129 Hypertensive chronic kidney disease with stage 1 through stage 4 chronic kidney disease, or unspecified chronic kidney disease: Secondary | ICD-10-CM | POA: Diagnosis not present

## 2023-10-19 DIAGNOSIS — F01A3 Vascular dementia, mild, with mood disturbance: Secondary | ICD-10-CM | POA: Diagnosis not present

## 2023-10-19 DIAGNOSIS — R531 Weakness: Secondary | ICD-10-CM | POA: Diagnosis not present

## 2023-10-19 DIAGNOSIS — I69398 Other sequelae of cerebral infarction: Secondary | ICD-10-CM | POA: Diagnosis not present

## 2023-10-19 DIAGNOSIS — F01A4 Vascular dementia, mild, with anxiety: Secondary | ICD-10-CM | POA: Diagnosis not present

## 2023-10-19 DIAGNOSIS — F02A4 Dementia in other diseases classified elsewhere, mild, with anxiety: Secondary | ICD-10-CM | POA: Diagnosis not present

## 2023-10-19 DIAGNOSIS — G301 Alzheimer's disease with late onset: Secondary | ICD-10-CM | POA: Diagnosis not present

## 2023-10-19 DIAGNOSIS — N183 Chronic kidney disease, stage 3 unspecified: Secondary | ICD-10-CM | POA: Diagnosis not present

## 2023-10-25 DIAGNOSIS — G301 Alzheimer's disease with late onset: Secondary | ICD-10-CM | POA: Diagnosis not present

## 2023-10-25 DIAGNOSIS — N183 Chronic kidney disease, stage 3 unspecified: Secondary | ICD-10-CM | POA: Diagnosis not present

## 2023-10-25 DIAGNOSIS — F02A3 Dementia in other diseases classified elsewhere, mild, with mood disturbance: Secondary | ICD-10-CM | POA: Diagnosis not present

## 2023-10-25 DIAGNOSIS — R531 Weakness: Secondary | ICD-10-CM | POA: Diagnosis not present

## 2023-10-25 DIAGNOSIS — F01A3 Vascular dementia, mild, with mood disturbance: Secondary | ICD-10-CM | POA: Diagnosis not present

## 2023-10-25 DIAGNOSIS — I129 Hypertensive chronic kidney disease with stage 1 through stage 4 chronic kidney disease, or unspecified chronic kidney disease: Secondary | ICD-10-CM | POA: Diagnosis not present

## 2023-10-25 DIAGNOSIS — I69398 Other sequelae of cerebral infarction: Secondary | ICD-10-CM | POA: Diagnosis not present

## 2023-10-25 DIAGNOSIS — F01A4 Vascular dementia, mild, with anxiety: Secondary | ICD-10-CM | POA: Diagnosis not present

## 2023-10-25 DIAGNOSIS — F02A4 Dementia in other diseases classified elsewhere, mild, with anxiety: Secondary | ICD-10-CM | POA: Diagnosis not present

## 2023-10-26 DIAGNOSIS — I69398 Other sequelae of cerebral infarction: Secondary | ICD-10-CM | POA: Diagnosis not present

## 2023-10-26 DIAGNOSIS — F01A3 Vascular dementia, mild, with mood disturbance: Secondary | ICD-10-CM | POA: Diagnosis not present

## 2023-10-26 DIAGNOSIS — I129 Hypertensive chronic kidney disease with stage 1 through stage 4 chronic kidney disease, or unspecified chronic kidney disease: Secondary | ICD-10-CM | POA: Diagnosis not present

## 2023-10-26 DIAGNOSIS — G301 Alzheimer's disease with late onset: Secondary | ICD-10-CM | POA: Diagnosis not present

## 2023-10-26 DIAGNOSIS — N183 Chronic kidney disease, stage 3 unspecified: Secondary | ICD-10-CM | POA: Diagnosis not present

## 2023-10-26 DIAGNOSIS — F02A3 Dementia in other diseases classified elsewhere, mild, with mood disturbance: Secondary | ICD-10-CM | POA: Diagnosis not present

## 2023-10-26 DIAGNOSIS — F01A4 Vascular dementia, mild, with anxiety: Secondary | ICD-10-CM | POA: Diagnosis not present

## 2023-10-26 DIAGNOSIS — R531 Weakness: Secondary | ICD-10-CM | POA: Diagnosis not present

## 2023-10-26 DIAGNOSIS — F02A4 Dementia in other diseases classified elsewhere, mild, with anxiety: Secondary | ICD-10-CM | POA: Diagnosis not present

## 2023-10-31 DIAGNOSIS — I129 Hypertensive chronic kidney disease with stage 1 through stage 4 chronic kidney disease, or unspecified chronic kidney disease: Secondary | ICD-10-CM | POA: Diagnosis not present

## 2023-10-31 DIAGNOSIS — F02A3 Dementia in other diseases classified elsewhere, mild, with mood disturbance: Secondary | ICD-10-CM | POA: Diagnosis not present

## 2023-10-31 DIAGNOSIS — F01A3 Vascular dementia, mild, with mood disturbance: Secondary | ICD-10-CM | POA: Diagnosis not present

## 2023-10-31 DIAGNOSIS — G301 Alzheimer's disease with late onset: Secondary | ICD-10-CM | POA: Diagnosis not present

## 2023-10-31 DIAGNOSIS — I69398 Other sequelae of cerebral infarction: Secondary | ICD-10-CM | POA: Diagnosis not present

## 2023-10-31 DIAGNOSIS — N183 Chronic kidney disease, stage 3 unspecified: Secondary | ICD-10-CM | POA: Diagnosis not present

## 2023-10-31 DIAGNOSIS — F01A4 Vascular dementia, mild, with anxiety: Secondary | ICD-10-CM | POA: Diagnosis not present

## 2023-10-31 DIAGNOSIS — F02A4 Dementia in other diseases classified elsewhere, mild, with anxiety: Secondary | ICD-10-CM | POA: Diagnosis not present

## 2023-10-31 DIAGNOSIS — R531 Weakness: Secondary | ICD-10-CM | POA: Diagnosis not present

## 2023-11-08 DIAGNOSIS — F02A4 Dementia in other diseases classified elsewhere, mild, with anxiety: Secondary | ICD-10-CM | POA: Diagnosis not present

## 2023-11-08 DIAGNOSIS — R531 Weakness: Secondary | ICD-10-CM | POA: Diagnosis not present

## 2023-11-08 DIAGNOSIS — F01A4 Vascular dementia, mild, with anxiety: Secondary | ICD-10-CM | POA: Diagnosis not present

## 2023-11-08 DIAGNOSIS — F01A3 Vascular dementia, mild, with mood disturbance: Secondary | ICD-10-CM | POA: Diagnosis not present

## 2023-11-08 DIAGNOSIS — G301 Alzheimer's disease with late onset: Secondary | ICD-10-CM | POA: Diagnosis not present

## 2023-11-08 DIAGNOSIS — I69398 Other sequelae of cerebral infarction: Secondary | ICD-10-CM | POA: Diagnosis not present

## 2023-11-08 DIAGNOSIS — N183 Chronic kidney disease, stage 3 unspecified: Secondary | ICD-10-CM | POA: Diagnosis not present

## 2023-11-08 DIAGNOSIS — F02A3 Dementia in other diseases classified elsewhere, mild, with mood disturbance: Secondary | ICD-10-CM | POA: Diagnosis not present

## 2023-11-08 DIAGNOSIS — I129 Hypertensive chronic kidney disease with stage 1 through stage 4 chronic kidney disease, or unspecified chronic kidney disease: Secondary | ICD-10-CM | POA: Diagnosis not present

## 2023-11-18 DIAGNOSIS — F01A3 Vascular dementia, mild, with mood disturbance: Secondary | ICD-10-CM | POA: Diagnosis not present

## 2023-11-18 DIAGNOSIS — F01A4 Vascular dementia, mild, with anxiety: Secondary | ICD-10-CM | POA: Diagnosis not present

## 2023-11-18 DIAGNOSIS — I69398 Other sequelae of cerebral infarction: Secondary | ICD-10-CM | POA: Diagnosis not present

## 2023-11-18 DIAGNOSIS — G301 Alzheimer's disease with late onset: Secondary | ICD-10-CM | POA: Diagnosis not present

## 2023-11-18 DIAGNOSIS — F02A4 Dementia in other diseases classified elsewhere, mild, with anxiety: Secondary | ICD-10-CM | POA: Diagnosis not present

## 2023-11-18 DIAGNOSIS — R531 Weakness: Secondary | ICD-10-CM | POA: Diagnosis not present

## 2023-11-18 DIAGNOSIS — F02A3 Dementia in other diseases classified elsewhere, mild, with mood disturbance: Secondary | ICD-10-CM | POA: Diagnosis not present

## 2023-11-21 DIAGNOSIS — F01A3 Vascular dementia, mild, with mood disturbance: Secondary | ICD-10-CM | POA: Diagnosis not present

## 2023-11-21 DIAGNOSIS — R531 Weakness: Secondary | ICD-10-CM | POA: Diagnosis not present

## 2023-11-21 DIAGNOSIS — I69398 Other sequelae of cerebral infarction: Secondary | ICD-10-CM | POA: Diagnosis not present

## 2023-11-21 DIAGNOSIS — N183 Chronic kidney disease, stage 3 unspecified: Secondary | ICD-10-CM | POA: Diagnosis not present

## 2023-11-21 DIAGNOSIS — I129 Hypertensive chronic kidney disease with stage 1 through stage 4 chronic kidney disease, or unspecified chronic kidney disease: Secondary | ICD-10-CM | POA: Diagnosis not present

## 2023-11-21 DIAGNOSIS — F02A4 Dementia in other diseases classified elsewhere, mild, with anxiety: Secondary | ICD-10-CM | POA: Diagnosis not present

## 2023-11-21 DIAGNOSIS — F02A3 Dementia in other diseases classified elsewhere, mild, with mood disturbance: Secondary | ICD-10-CM | POA: Diagnosis not present

## 2023-11-21 DIAGNOSIS — F01A4 Vascular dementia, mild, with anxiety: Secondary | ICD-10-CM | POA: Diagnosis not present

## 2023-11-21 DIAGNOSIS — G301 Alzheimer's disease with late onset: Secondary | ICD-10-CM | POA: Diagnosis not present

## 2023-11-28 DIAGNOSIS — R531 Weakness: Secondary | ICD-10-CM | POA: Diagnosis not present

## 2023-11-28 DIAGNOSIS — F02A3 Dementia in other diseases classified elsewhere, mild, with mood disturbance: Secondary | ICD-10-CM | POA: Diagnosis not present

## 2023-11-28 DIAGNOSIS — F01A4 Vascular dementia, mild, with anxiety: Secondary | ICD-10-CM | POA: Diagnosis not present

## 2023-11-28 DIAGNOSIS — I129 Hypertensive chronic kidney disease with stage 1 through stage 4 chronic kidney disease, or unspecified chronic kidney disease: Secondary | ICD-10-CM | POA: Diagnosis not present

## 2023-11-28 DIAGNOSIS — G301 Alzheimer's disease with late onset: Secondary | ICD-10-CM | POA: Diagnosis not present

## 2023-11-28 DIAGNOSIS — F01A3 Vascular dementia, mild, with mood disturbance: Secondary | ICD-10-CM | POA: Diagnosis not present

## 2023-11-28 DIAGNOSIS — N183 Chronic kidney disease, stage 3 unspecified: Secondary | ICD-10-CM | POA: Diagnosis not present

## 2023-11-28 DIAGNOSIS — F02A4 Dementia in other diseases classified elsewhere, mild, with anxiety: Secondary | ICD-10-CM | POA: Diagnosis not present

## 2023-11-28 DIAGNOSIS — I69398 Other sequelae of cerebral infarction: Secondary | ICD-10-CM | POA: Diagnosis not present

## 2023-12-07 DIAGNOSIS — I129 Hypertensive chronic kidney disease with stage 1 through stage 4 chronic kidney disease, or unspecified chronic kidney disease: Secondary | ICD-10-CM | POA: Diagnosis not present

## 2023-12-07 DIAGNOSIS — F01A3 Vascular dementia, mild, with mood disturbance: Secondary | ICD-10-CM | POA: Diagnosis not present

## 2023-12-07 DIAGNOSIS — G301 Alzheimer's disease with late onset: Secondary | ICD-10-CM | POA: Diagnosis not present

## 2023-12-07 DIAGNOSIS — I69398 Other sequelae of cerebral infarction: Secondary | ICD-10-CM | POA: Diagnosis not present

## 2023-12-07 DIAGNOSIS — F01A4 Vascular dementia, mild, with anxiety: Secondary | ICD-10-CM | POA: Diagnosis not present

## 2023-12-07 DIAGNOSIS — F02A4 Dementia in other diseases classified elsewhere, mild, with anxiety: Secondary | ICD-10-CM | POA: Diagnosis not present

## 2023-12-07 DIAGNOSIS — N183 Chronic kidney disease, stage 3 unspecified: Secondary | ICD-10-CM | POA: Diagnosis not present

## 2023-12-07 DIAGNOSIS — F02A3 Dementia in other diseases classified elsewhere, mild, with mood disturbance: Secondary | ICD-10-CM | POA: Diagnosis not present

## 2023-12-07 DIAGNOSIS — R531 Weakness: Secondary | ICD-10-CM | POA: Diagnosis not present

## 2023-12-13 DIAGNOSIS — I69398 Other sequelae of cerebral infarction: Secondary | ICD-10-CM | POA: Diagnosis not present

## 2023-12-13 DIAGNOSIS — R531 Weakness: Secondary | ICD-10-CM | POA: Diagnosis not present

## 2023-12-13 DIAGNOSIS — I129 Hypertensive chronic kidney disease with stage 1 through stage 4 chronic kidney disease, or unspecified chronic kidney disease: Secondary | ICD-10-CM | POA: Diagnosis not present

## 2023-12-13 DIAGNOSIS — F02A3 Dementia in other diseases classified elsewhere, mild, with mood disturbance: Secondary | ICD-10-CM | POA: Diagnosis not present

## 2023-12-13 DIAGNOSIS — F02A4 Dementia in other diseases classified elsewhere, mild, with anxiety: Secondary | ICD-10-CM | POA: Diagnosis not present

## 2023-12-13 DIAGNOSIS — G301 Alzheimer's disease with late onset: Secondary | ICD-10-CM | POA: Diagnosis not present

## 2023-12-13 DIAGNOSIS — N183 Chronic kidney disease, stage 3 unspecified: Secondary | ICD-10-CM | POA: Diagnosis not present

## 2023-12-13 DIAGNOSIS — F01A4 Vascular dementia, mild, with anxiety: Secondary | ICD-10-CM | POA: Diagnosis not present

## 2023-12-13 DIAGNOSIS — F01A3 Vascular dementia, mild, with mood disturbance: Secondary | ICD-10-CM | POA: Diagnosis not present

## 2023-12-19 DIAGNOSIS — I129 Hypertensive chronic kidney disease with stage 1 through stage 4 chronic kidney disease, or unspecified chronic kidney disease: Secondary | ICD-10-CM | POA: Diagnosis not present

## 2023-12-19 DIAGNOSIS — F01A4 Vascular dementia, mild, with anxiety: Secondary | ICD-10-CM | POA: Diagnosis not present

## 2023-12-19 DIAGNOSIS — I69398 Other sequelae of cerebral infarction: Secondary | ICD-10-CM | POA: Diagnosis not present

## 2023-12-19 DIAGNOSIS — F02A4 Dementia in other diseases classified elsewhere, mild, with anxiety: Secondary | ICD-10-CM | POA: Diagnosis not present

## 2023-12-19 DIAGNOSIS — F02A3 Dementia in other diseases classified elsewhere, mild, with mood disturbance: Secondary | ICD-10-CM | POA: Diagnosis not present

## 2023-12-19 DIAGNOSIS — N183 Chronic kidney disease, stage 3 unspecified: Secondary | ICD-10-CM | POA: Diagnosis not present

## 2023-12-19 DIAGNOSIS — F01A3 Vascular dementia, mild, with mood disturbance: Secondary | ICD-10-CM | POA: Diagnosis not present

## 2023-12-19 DIAGNOSIS — G301 Alzheimer's disease with late onset: Secondary | ICD-10-CM | POA: Diagnosis not present

## 2023-12-19 DIAGNOSIS — R531 Weakness: Secondary | ICD-10-CM | POA: Diagnosis not present

## 2023-12-21 DIAGNOSIS — F01A4 Vascular dementia, mild, with anxiety: Secondary | ICD-10-CM | POA: Diagnosis not present

## 2023-12-21 DIAGNOSIS — F02A3 Dementia in other diseases classified elsewhere, mild, with mood disturbance: Secondary | ICD-10-CM | POA: Diagnosis not present

## 2023-12-21 DIAGNOSIS — R531 Weakness: Secondary | ICD-10-CM | POA: Diagnosis not present

## 2023-12-21 DIAGNOSIS — F01A3 Vascular dementia, mild, with mood disturbance: Secondary | ICD-10-CM | POA: Diagnosis not present

## 2023-12-21 DIAGNOSIS — I69398 Other sequelae of cerebral infarction: Secondary | ICD-10-CM | POA: Diagnosis not present

## 2023-12-21 DIAGNOSIS — G301 Alzheimer's disease with late onset: Secondary | ICD-10-CM | POA: Diagnosis not present

## 2023-12-21 DIAGNOSIS — F02A4 Dementia in other diseases classified elsewhere, mild, with anxiety: Secondary | ICD-10-CM | POA: Diagnosis not present

## 2023-12-26 DIAGNOSIS — G301 Alzheimer's disease with late onset: Secondary | ICD-10-CM | POA: Diagnosis not present

## 2023-12-26 DIAGNOSIS — F02A4 Dementia in other diseases classified elsewhere, mild, with anxiety: Secondary | ICD-10-CM | POA: Diagnosis not present

## 2023-12-26 DIAGNOSIS — F01A4 Vascular dementia, mild, with anxiety: Secondary | ICD-10-CM | POA: Diagnosis not present

## 2023-12-26 DIAGNOSIS — R531 Weakness: Secondary | ICD-10-CM | POA: Diagnosis not present

## 2023-12-26 DIAGNOSIS — F01A3 Vascular dementia, mild, with mood disturbance: Secondary | ICD-10-CM | POA: Diagnosis not present

## 2023-12-26 DIAGNOSIS — F02A3 Dementia in other diseases classified elsewhere, mild, with mood disturbance: Secondary | ICD-10-CM | POA: Diagnosis not present

## 2023-12-26 DIAGNOSIS — I69398 Other sequelae of cerebral infarction: Secondary | ICD-10-CM | POA: Diagnosis not present

## 2023-12-26 DIAGNOSIS — N183 Chronic kidney disease, stage 3 unspecified: Secondary | ICD-10-CM | POA: Diagnosis not present

## 2023-12-26 DIAGNOSIS — I129 Hypertensive chronic kidney disease with stage 1 through stage 4 chronic kidney disease, or unspecified chronic kidney disease: Secondary | ICD-10-CM | POA: Diagnosis not present

## 2023-12-29 DIAGNOSIS — F01A3 Vascular dementia, mild, with mood disturbance: Secondary | ICD-10-CM | POA: Diagnosis not present

## 2023-12-29 DIAGNOSIS — I129 Hypertensive chronic kidney disease with stage 1 through stage 4 chronic kidney disease, or unspecified chronic kidney disease: Secondary | ICD-10-CM | POA: Diagnosis not present

## 2023-12-29 DIAGNOSIS — F02A3 Dementia in other diseases classified elsewhere, mild, with mood disturbance: Secondary | ICD-10-CM | POA: Diagnosis not present

## 2023-12-29 DIAGNOSIS — N183 Chronic kidney disease, stage 3 unspecified: Secondary | ICD-10-CM | POA: Diagnosis not present

## 2023-12-29 DIAGNOSIS — R531 Weakness: Secondary | ICD-10-CM | POA: Diagnosis not present

## 2023-12-29 DIAGNOSIS — F02A4 Dementia in other diseases classified elsewhere, mild, with anxiety: Secondary | ICD-10-CM | POA: Diagnosis not present

## 2023-12-29 DIAGNOSIS — I69398 Other sequelae of cerebral infarction: Secondary | ICD-10-CM | POA: Diagnosis not present

## 2023-12-29 DIAGNOSIS — G301 Alzheimer's disease with late onset: Secondary | ICD-10-CM | POA: Diagnosis not present

## 2023-12-29 DIAGNOSIS — F01A4 Vascular dementia, mild, with anxiety: Secondary | ICD-10-CM | POA: Diagnosis not present

## 2024-01-03 DIAGNOSIS — F01A3 Vascular dementia, mild, with mood disturbance: Secondary | ICD-10-CM | POA: Diagnosis not present

## 2024-01-03 DIAGNOSIS — I129 Hypertensive chronic kidney disease with stage 1 through stage 4 chronic kidney disease, or unspecified chronic kidney disease: Secondary | ICD-10-CM | POA: Diagnosis not present

## 2024-01-03 DIAGNOSIS — N183 Chronic kidney disease, stage 3 unspecified: Secondary | ICD-10-CM | POA: Diagnosis not present

## 2024-01-03 DIAGNOSIS — G301 Alzheimer's disease with late onset: Secondary | ICD-10-CM | POA: Diagnosis not present

## 2024-01-03 DIAGNOSIS — R531 Weakness: Secondary | ICD-10-CM | POA: Diagnosis not present

## 2024-01-03 DIAGNOSIS — F02A3 Dementia in other diseases classified elsewhere, mild, with mood disturbance: Secondary | ICD-10-CM | POA: Diagnosis not present

## 2024-01-03 DIAGNOSIS — F01A4 Vascular dementia, mild, with anxiety: Secondary | ICD-10-CM | POA: Diagnosis not present

## 2024-01-03 DIAGNOSIS — F02A4 Dementia in other diseases classified elsewhere, mild, with anxiety: Secondary | ICD-10-CM | POA: Diagnosis not present

## 2024-01-03 DIAGNOSIS — I69398 Other sequelae of cerebral infarction: Secondary | ICD-10-CM | POA: Diagnosis not present

## 2024-01-06 DIAGNOSIS — G301 Alzheimer's disease with late onset: Secondary | ICD-10-CM | POA: Diagnosis not present

## 2024-01-06 DIAGNOSIS — I129 Hypertensive chronic kidney disease with stage 1 through stage 4 chronic kidney disease, or unspecified chronic kidney disease: Secondary | ICD-10-CM | POA: Diagnosis not present

## 2024-01-06 DIAGNOSIS — F02A4 Dementia in other diseases classified elsewhere, mild, with anxiety: Secondary | ICD-10-CM | POA: Diagnosis not present

## 2024-01-06 DIAGNOSIS — N183 Chronic kidney disease, stage 3 unspecified: Secondary | ICD-10-CM | POA: Diagnosis not present

## 2024-01-06 DIAGNOSIS — F01A3 Vascular dementia, mild, with mood disturbance: Secondary | ICD-10-CM | POA: Diagnosis not present

## 2024-01-06 DIAGNOSIS — F01A4 Vascular dementia, mild, with anxiety: Secondary | ICD-10-CM | POA: Diagnosis not present

## 2024-01-06 DIAGNOSIS — I69398 Other sequelae of cerebral infarction: Secondary | ICD-10-CM | POA: Diagnosis not present

## 2024-01-06 DIAGNOSIS — R531 Weakness: Secondary | ICD-10-CM | POA: Diagnosis not present

## 2024-01-06 DIAGNOSIS — F02A3 Dementia in other diseases classified elsewhere, mild, with mood disturbance: Secondary | ICD-10-CM | POA: Diagnosis not present

## 2024-01-13 DIAGNOSIS — F02A4 Dementia in other diseases classified elsewhere, mild, with anxiety: Secondary | ICD-10-CM | POA: Diagnosis not present

## 2024-01-13 DIAGNOSIS — G301 Alzheimer's disease with late onset: Secondary | ICD-10-CM | POA: Diagnosis not present

## 2024-01-13 DIAGNOSIS — I69398 Other sequelae of cerebral infarction: Secondary | ICD-10-CM | POA: Diagnosis not present

## 2024-01-13 DIAGNOSIS — F02A3 Dementia in other diseases classified elsewhere, mild, with mood disturbance: Secondary | ICD-10-CM | POA: Diagnosis not present

## 2024-01-13 DIAGNOSIS — I129 Hypertensive chronic kidney disease with stage 1 through stage 4 chronic kidney disease, or unspecified chronic kidney disease: Secondary | ICD-10-CM | POA: Diagnosis not present

## 2024-01-13 DIAGNOSIS — N183 Chronic kidney disease, stage 3 unspecified: Secondary | ICD-10-CM | POA: Diagnosis not present

## 2024-01-13 DIAGNOSIS — F01A4 Vascular dementia, mild, with anxiety: Secondary | ICD-10-CM | POA: Diagnosis not present

## 2024-01-13 DIAGNOSIS — R531 Weakness: Secondary | ICD-10-CM | POA: Diagnosis not present

## 2024-01-13 DIAGNOSIS — F01A3 Vascular dementia, mild, with mood disturbance: Secondary | ICD-10-CM | POA: Diagnosis not present

## 2024-01-16 DIAGNOSIS — R531 Weakness: Secondary | ICD-10-CM | POA: Diagnosis not present

## 2024-01-16 DIAGNOSIS — F01A4 Vascular dementia, mild, with anxiety: Secondary | ICD-10-CM | POA: Diagnosis not present

## 2024-01-16 DIAGNOSIS — G301 Alzheimer's disease with late onset: Secondary | ICD-10-CM | POA: Diagnosis not present

## 2024-01-16 DIAGNOSIS — I69398 Other sequelae of cerebral infarction: Secondary | ICD-10-CM | POA: Diagnosis not present

## 2024-01-16 DIAGNOSIS — F01A3 Vascular dementia, mild, with mood disturbance: Secondary | ICD-10-CM | POA: Diagnosis not present

## 2024-01-16 DIAGNOSIS — N183 Chronic kidney disease, stage 3 unspecified: Secondary | ICD-10-CM | POA: Diagnosis not present

## 2024-01-16 DIAGNOSIS — F02A3 Dementia in other diseases classified elsewhere, mild, with mood disturbance: Secondary | ICD-10-CM | POA: Diagnosis not present

## 2024-01-16 DIAGNOSIS — F02A4 Dementia in other diseases classified elsewhere, mild, with anxiety: Secondary | ICD-10-CM | POA: Diagnosis not present

## 2024-01-16 DIAGNOSIS — I129 Hypertensive chronic kidney disease with stage 1 through stage 4 chronic kidney disease, or unspecified chronic kidney disease: Secondary | ICD-10-CM | POA: Diagnosis not present

## 2024-01-23 DIAGNOSIS — F01A3 Vascular dementia, mild, with mood disturbance: Secondary | ICD-10-CM | POA: Diagnosis not present

## 2024-01-23 DIAGNOSIS — I129 Hypertensive chronic kidney disease with stage 1 through stage 4 chronic kidney disease, or unspecified chronic kidney disease: Secondary | ICD-10-CM | POA: Diagnosis not present

## 2024-01-23 DIAGNOSIS — N183 Chronic kidney disease, stage 3 unspecified: Secondary | ICD-10-CM | POA: Diagnosis not present

## 2024-01-23 DIAGNOSIS — R531 Weakness: Secondary | ICD-10-CM | POA: Diagnosis not present

## 2024-01-23 DIAGNOSIS — G301 Alzheimer's disease with late onset: Secondary | ICD-10-CM | POA: Diagnosis not present

## 2024-01-23 DIAGNOSIS — F02A4 Dementia in other diseases classified elsewhere, mild, with anxiety: Secondary | ICD-10-CM | POA: Diagnosis not present

## 2024-01-23 DIAGNOSIS — F01A4 Vascular dementia, mild, with anxiety: Secondary | ICD-10-CM | POA: Diagnosis not present

## 2024-01-23 DIAGNOSIS — I69398 Other sequelae of cerebral infarction: Secondary | ICD-10-CM | POA: Diagnosis not present

## 2024-01-23 DIAGNOSIS — F02A3 Dementia in other diseases classified elsewhere, mild, with mood disturbance: Secondary | ICD-10-CM | POA: Diagnosis not present

## 2024-01-27 DIAGNOSIS — F02A4 Dementia in other diseases classified elsewhere, mild, with anxiety: Secondary | ICD-10-CM | POA: Diagnosis not present

## 2024-01-27 DIAGNOSIS — I69398 Other sequelae of cerebral infarction: Secondary | ICD-10-CM | POA: Diagnosis not present

## 2024-01-27 DIAGNOSIS — F01A4 Vascular dementia, mild, with anxiety: Secondary | ICD-10-CM | POA: Diagnosis not present

## 2024-01-27 DIAGNOSIS — G301 Alzheimer's disease with late onset: Secondary | ICD-10-CM | POA: Diagnosis not present

## 2024-01-27 DIAGNOSIS — N183 Chronic kidney disease, stage 3 unspecified: Secondary | ICD-10-CM | POA: Diagnosis not present

## 2024-01-27 DIAGNOSIS — F01A3 Vascular dementia, mild, with mood disturbance: Secondary | ICD-10-CM | POA: Diagnosis not present

## 2024-01-27 DIAGNOSIS — F02A3 Dementia in other diseases classified elsewhere, mild, with mood disturbance: Secondary | ICD-10-CM | POA: Diagnosis not present

## 2024-01-27 DIAGNOSIS — R531 Weakness: Secondary | ICD-10-CM | POA: Diagnosis not present

## 2024-01-27 DIAGNOSIS — I129 Hypertensive chronic kidney disease with stage 1 through stage 4 chronic kidney disease, or unspecified chronic kidney disease: Secondary | ICD-10-CM | POA: Diagnosis not present

## 2024-01-30 DIAGNOSIS — G301 Alzheimer's disease with late onset: Secondary | ICD-10-CM | POA: Diagnosis not present

## 2024-01-30 DIAGNOSIS — F02A3 Dementia in other diseases classified elsewhere, mild, with mood disturbance: Secondary | ICD-10-CM | POA: Diagnosis not present

## 2024-01-30 DIAGNOSIS — N183 Chronic kidney disease, stage 3 unspecified: Secondary | ICD-10-CM | POA: Diagnosis not present

## 2024-01-30 DIAGNOSIS — R531 Weakness: Secondary | ICD-10-CM | POA: Diagnosis not present

## 2024-01-30 DIAGNOSIS — F01A4 Vascular dementia, mild, with anxiety: Secondary | ICD-10-CM | POA: Diagnosis not present

## 2024-01-30 DIAGNOSIS — F01A3 Vascular dementia, mild, with mood disturbance: Secondary | ICD-10-CM | POA: Diagnosis not present

## 2024-01-30 DIAGNOSIS — I129 Hypertensive chronic kidney disease with stage 1 through stage 4 chronic kidney disease, or unspecified chronic kidney disease: Secondary | ICD-10-CM | POA: Diagnosis not present

## 2024-01-30 DIAGNOSIS — F02A4 Dementia in other diseases classified elsewhere, mild, with anxiety: Secondary | ICD-10-CM | POA: Diagnosis not present

## 2024-01-30 DIAGNOSIS — I69398 Other sequelae of cerebral infarction: Secondary | ICD-10-CM | POA: Diagnosis not present

## 2024-02-02 DIAGNOSIS — F02A3 Dementia in other diseases classified elsewhere, mild, with mood disturbance: Secondary | ICD-10-CM | POA: Diagnosis not present

## 2024-02-02 DIAGNOSIS — R531 Weakness: Secondary | ICD-10-CM | POA: Diagnosis not present

## 2024-02-02 DIAGNOSIS — I129 Hypertensive chronic kidney disease with stage 1 through stage 4 chronic kidney disease, or unspecified chronic kidney disease: Secondary | ICD-10-CM | POA: Diagnosis not present

## 2024-02-02 DIAGNOSIS — N183 Chronic kidney disease, stage 3 unspecified: Secondary | ICD-10-CM | POA: Diagnosis not present

## 2024-02-02 DIAGNOSIS — F01A4 Vascular dementia, mild, with anxiety: Secondary | ICD-10-CM | POA: Diagnosis not present

## 2024-02-02 DIAGNOSIS — G301 Alzheimer's disease with late onset: Secondary | ICD-10-CM | POA: Diagnosis not present

## 2024-02-02 DIAGNOSIS — I69398 Other sequelae of cerebral infarction: Secondary | ICD-10-CM | POA: Diagnosis not present

## 2024-02-02 DIAGNOSIS — F02A4 Dementia in other diseases classified elsewhere, mild, with anxiety: Secondary | ICD-10-CM | POA: Diagnosis not present

## 2024-02-02 DIAGNOSIS — F01A3 Vascular dementia, mild, with mood disturbance: Secondary | ICD-10-CM | POA: Diagnosis not present

## 2024-02-06 DIAGNOSIS — I129 Hypertensive chronic kidney disease with stage 1 through stage 4 chronic kidney disease, or unspecified chronic kidney disease: Secondary | ICD-10-CM | POA: Diagnosis not present

## 2024-02-06 DIAGNOSIS — F02A3 Dementia in other diseases classified elsewhere, mild, with mood disturbance: Secondary | ICD-10-CM | POA: Diagnosis not present

## 2024-02-06 DIAGNOSIS — N183 Chronic kidney disease, stage 3 unspecified: Secondary | ICD-10-CM | POA: Diagnosis not present

## 2024-02-06 DIAGNOSIS — F01A4 Vascular dementia, mild, with anxiety: Secondary | ICD-10-CM | POA: Diagnosis not present

## 2024-02-06 DIAGNOSIS — F02A4 Dementia in other diseases classified elsewhere, mild, with anxiety: Secondary | ICD-10-CM | POA: Diagnosis not present

## 2024-02-06 DIAGNOSIS — G301 Alzheimer's disease with late onset: Secondary | ICD-10-CM | POA: Diagnosis not present

## 2024-02-06 DIAGNOSIS — R531 Weakness: Secondary | ICD-10-CM | POA: Diagnosis not present

## 2024-02-06 DIAGNOSIS — I69398 Other sequelae of cerebral infarction: Secondary | ICD-10-CM | POA: Diagnosis not present

## 2024-02-06 DIAGNOSIS — F01A3 Vascular dementia, mild, with mood disturbance: Secondary | ICD-10-CM | POA: Diagnosis not present

## 2024-02-13 DIAGNOSIS — F02A4 Dementia in other diseases classified elsewhere, mild, with anxiety: Secondary | ICD-10-CM | POA: Diagnosis not present

## 2024-02-13 DIAGNOSIS — F01A3 Vascular dementia, mild, with mood disturbance: Secondary | ICD-10-CM | POA: Diagnosis not present

## 2024-02-13 DIAGNOSIS — F01A4 Vascular dementia, mild, with anxiety: Secondary | ICD-10-CM | POA: Diagnosis not present

## 2024-02-13 DIAGNOSIS — I69398 Other sequelae of cerebral infarction: Secondary | ICD-10-CM | POA: Diagnosis not present

## 2024-02-13 DIAGNOSIS — I129 Hypertensive chronic kidney disease with stage 1 through stage 4 chronic kidney disease, or unspecified chronic kidney disease: Secondary | ICD-10-CM | POA: Diagnosis not present

## 2024-02-13 DIAGNOSIS — R531 Weakness: Secondary | ICD-10-CM | POA: Diagnosis not present

## 2024-02-13 DIAGNOSIS — G301 Alzheimer's disease with late onset: Secondary | ICD-10-CM | POA: Diagnosis not present

## 2024-02-13 DIAGNOSIS — F02A3 Dementia in other diseases classified elsewhere, mild, with mood disturbance: Secondary | ICD-10-CM | POA: Diagnosis not present

## 2024-02-13 DIAGNOSIS — N183 Chronic kidney disease, stage 3 unspecified: Secondary | ICD-10-CM | POA: Diagnosis not present

## 2024-02-14 DIAGNOSIS — R0683 Snoring: Secondary | ICD-10-CM | POA: Diagnosis not present

## 2024-02-14 DIAGNOSIS — F0150 Vascular dementia without behavioral disturbance: Secondary | ICD-10-CM | POA: Diagnosis not present

## 2024-02-14 DIAGNOSIS — H40003 Preglaucoma, unspecified, bilateral: Secondary | ICD-10-CM | POA: Diagnosis not present

## 2024-02-14 DIAGNOSIS — H2513 Age-related nuclear cataract, bilateral: Secondary | ICD-10-CM | POA: Diagnosis not present

## 2024-02-14 DIAGNOSIS — H353213 Exudative age-related macular degeneration, right eye, with inactive scar: Secondary | ICD-10-CM | POA: Diagnosis not present

## 2024-02-14 DIAGNOSIS — E538 Deficiency of other specified B group vitamins: Secondary | ICD-10-CM | POA: Diagnosis not present

## 2024-02-14 DIAGNOSIS — H353233 Exudative age-related macular degeneration, bilateral, with inactive scar: Secondary | ICD-10-CM | POA: Diagnosis not present

## 2024-02-14 DIAGNOSIS — H353122 Nonexudative age-related macular degeneration, left eye, intermediate dry stage: Secondary | ICD-10-CM | POA: Diagnosis not present

## 2024-02-14 DIAGNOSIS — F0280 Dementia in other diseases classified elsewhere without behavioral disturbance: Secondary | ICD-10-CM | POA: Diagnosis not present

## 2024-02-14 DIAGNOSIS — H900 Conductive hearing loss, bilateral: Secondary | ICD-10-CM | POA: Diagnosis not present

## 2024-02-14 DIAGNOSIS — G309 Alzheimer's disease, unspecified: Secondary | ICD-10-CM | POA: Diagnosis not present

## 2024-02-14 DIAGNOSIS — Z8673 Personal history of transient ischemic attack (TIA), and cerebral infarction without residual deficits: Secondary | ICD-10-CM | POA: Diagnosis not present

## 2024-02-15 DIAGNOSIS — F02A3 Dementia in other diseases classified elsewhere, mild, with mood disturbance: Secondary | ICD-10-CM | POA: Diagnosis not present

## 2024-02-15 DIAGNOSIS — I69398 Other sequelae of cerebral infarction: Secondary | ICD-10-CM | POA: Diagnosis not present

## 2024-02-15 DIAGNOSIS — G301 Alzheimer's disease with late onset: Secondary | ICD-10-CM | POA: Diagnosis not present

## 2024-02-15 DIAGNOSIS — N183 Chronic kidney disease, stage 3 unspecified: Secondary | ICD-10-CM | POA: Diagnosis not present

## 2024-02-15 DIAGNOSIS — F01A3 Vascular dementia, mild, with mood disturbance: Secondary | ICD-10-CM | POA: Diagnosis not present

## 2024-02-15 DIAGNOSIS — I129 Hypertensive chronic kidney disease with stage 1 through stage 4 chronic kidney disease, or unspecified chronic kidney disease: Secondary | ICD-10-CM | POA: Diagnosis not present

## 2024-02-15 DIAGNOSIS — F01A4 Vascular dementia, mild, with anxiety: Secondary | ICD-10-CM | POA: Diagnosis not present

## 2024-02-15 DIAGNOSIS — R531 Weakness: Secondary | ICD-10-CM | POA: Diagnosis not present

## 2024-02-15 DIAGNOSIS — F02A4 Dementia in other diseases classified elsewhere, mild, with anxiety: Secondary | ICD-10-CM | POA: Diagnosis not present

## 2024-02-22 DIAGNOSIS — Z87891 Personal history of nicotine dependence: Secondary | ICD-10-CM | POA: Diagnosis not present

## 2024-02-22 DIAGNOSIS — E7800 Pure hypercholesterolemia, unspecified: Secondary | ICD-10-CM | POA: Diagnosis not present

## 2024-02-22 DIAGNOSIS — N1831 Chronic kidney disease, stage 3a: Secondary | ICD-10-CM | POA: Diagnosis not present

## 2024-02-22 DIAGNOSIS — F0280 Dementia in other diseases classified elsewhere without behavioral disturbance: Secondary | ICD-10-CM | POA: Diagnosis not present

## 2024-02-22 DIAGNOSIS — F0150 Vascular dementia without behavioral disturbance: Secondary | ICD-10-CM | POA: Diagnosis not present

## 2024-02-22 DIAGNOSIS — Z1331 Encounter for screening for depression: Secondary | ICD-10-CM | POA: Diagnosis not present

## 2024-02-22 DIAGNOSIS — F32A Depression, unspecified: Secondary | ICD-10-CM | POA: Diagnosis not present

## 2024-02-22 DIAGNOSIS — G309 Alzheimer's disease, unspecified: Secondary | ICD-10-CM | POA: Diagnosis not present

## 2024-02-22 DIAGNOSIS — Z0000 Encounter for general adult medical examination without abnormal findings: Secondary | ICD-10-CM | POA: Diagnosis not present

## 2024-02-22 DIAGNOSIS — E785 Hyperlipidemia, unspecified: Secondary | ICD-10-CM | POA: Diagnosis not present

## 2024-02-22 DIAGNOSIS — R001 Bradycardia, unspecified: Secondary | ICD-10-CM | POA: Diagnosis not present

## 2024-02-22 DIAGNOSIS — I129 Hypertensive chronic kidney disease with stage 1 through stage 4 chronic kidney disease, or unspecified chronic kidney disease: Secondary | ICD-10-CM | POA: Diagnosis not present

## 2024-02-22 DIAGNOSIS — N183 Chronic kidney disease, stage 3 unspecified: Secondary | ICD-10-CM | POA: Diagnosis not present

## 2024-02-22 DIAGNOSIS — E538 Deficiency of other specified B group vitamins: Secondary | ICD-10-CM | POA: Diagnosis not present

## 2024-02-22 DIAGNOSIS — K219 Gastro-esophageal reflux disease without esophagitis: Secondary | ICD-10-CM | POA: Diagnosis not present

## 2024-02-22 DIAGNOSIS — I10 Essential (primary) hypertension: Secondary | ICD-10-CM | POA: Diagnosis not present

## 2024-03-09 DIAGNOSIS — R001 Bradycardia, unspecified: Secondary | ICD-10-CM | POA: Diagnosis not present

## 2024-08-21 DIAGNOSIS — H40003 Preglaucoma, unspecified, bilateral: Secondary | ICD-10-CM | POA: Diagnosis not present

## 2024-09-03 DIAGNOSIS — K219 Gastro-esophageal reflux disease without esophagitis: Secondary | ICD-10-CM | POA: Diagnosis not present

## 2024-09-03 DIAGNOSIS — I129 Hypertensive chronic kidney disease with stage 1 through stage 4 chronic kidney disease, or unspecified chronic kidney disease: Secondary | ICD-10-CM | POA: Diagnosis not present

## 2024-09-03 DIAGNOSIS — E538 Deficiency of other specified B group vitamins: Secondary | ICD-10-CM | POA: Diagnosis not present

## 2024-09-03 DIAGNOSIS — N1831 Chronic kidney disease, stage 3a: Secondary | ICD-10-CM | POA: Diagnosis not present

## 2024-09-03 DIAGNOSIS — E7800 Pure hypercholesterolemia, unspecified: Secondary | ICD-10-CM | POA: Diagnosis not present

## 2024-09-03 DIAGNOSIS — F0284 Dementia in other diseases classified elsewhere, unspecified severity, with anxiety: Secondary | ICD-10-CM | POA: Diagnosis not present

## 2024-09-03 DIAGNOSIS — M7918 Myalgia, other site: Secondary | ICD-10-CM | POA: Diagnosis not present

## 2024-09-03 DIAGNOSIS — Z23 Encounter for immunization: Secondary | ICD-10-CM | POA: Diagnosis not present

## 2024-09-03 DIAGNOSIS — I10 Essential (primary) hypertension: Secondary | ICD-10-CM | POA: Diagnosis not present

## 2024-09-03 DIAGNOSIS — Z131 Encounter for screening for diabetes mellitus: Secondary | ICD-10-CM | POA: Diagnosis not present

## 2024-09-03 DIAGNOSIS — G309 Alzheimer's disease, unspecified: Secondary | ICD-10-CM | POA: Diagnosis not present

## 2024-09-03 DIAGNOSIS — F0154 Vascular dementia, unspecified severity, with anxiety: Secondary | ICD-10-CM | POA: Diagnosis not present
